# Patient Record
Sex: Female | Born: 1940 | Race: White | Hispanic: No | Smoking: Current some day smoker
Health system: Southern US, Community
[De-identification: ages and names within clinical notes are randomized; demographics above are authoritative.]

## PROBLEM LIST (undated history)

## (undated) DIAGNOSIS — R112 Nausea with vomiting, unspecified: Secondary | ICD-10-CM

## (undated) DIAGNOSIS — R0602 Shortness of breath: Secondary | ICD-10-CM

## (undated) DIAGNOSIS — Z9889 Other specified postprocedural states: Secondary | ICD-10-CM

## (undated) DIAGNOSIS — I10 Essential (primary) hypertension: Secondary | ICD-10-CM

## (undated) DIAGNOSIS — E7800 Pure hypercholesterolemia, unspecified: Secondary | ICD-10-CM

## (undated) DIAGNOSIS — G44009 Cluster headache syndrome, unspecified, not intractable: Secondary | ICD-10-CM

## (undated) DIAGNOSIS — T8859XA Other complications of anesthesia, initial encounter: Secondary | ICD-10-CM

## (undated) DIAGNOSIS — T4145XA Adverse effect of unspecified anesthetic, initial encounter: Secondary | ICD-10-CM

## (undated) DIAGNOSIS — I639 Cerebral infarction, unspecified: Secondary | ICD-10-CM

## (undated) DIAGNOSIS — D649 Anemia, unspecified: Secondary | ICD-10-CM

## (undated) DIAGNOSIS — M1990 Unspecified osteoarthritis, unspecified site: Secondary | ICD-10-CM

## (undated) DIAGNOSIS — J449 Chronic obstructive pulmonary disease, unspecified: Secondary | ICD-10-CM

## (undated) DIAGNOSIS — Z973 Presence of spectacles and contact lenses: Secondary | ICD-10-CM

## (undated) HISTORY — DX: Essential (primary) hypertension: I10

## (undated) HISTORY — DX: Presence of spectacles and contact lenses: Z97.3

## (undated) HISTORY — PX: OTHER SURGICAL HISTORY: SHX169

## (undated) HISTORY — DX: Cluster headache syndrome, unspecified, not intractable: G44.009

## (undated) HISTORY — PX: VARICOSE VEIN SURGERY: SHX832

## (undated) HISTORY — DX: Chronic obstructive pulmonary disease, unspecified: J44.9

## (undated) HISTORY — DX: Cerebral infarction, unspecified: I63.9

## (undated) HISTORY — DX: Anemia, unspecified: D64.9

## (undated) HISTORY — PX: ABDOMINAL HYSTERECTOMY: SHX81

## (undated) HISTORY — PX: HAND SURGERY: SHX662

---

## 1998-02-18 ENCOUNTER — Other Ambulatory Visit: Admission: RE | Admit: 1998-02-18 | Discharge: 1998-02-18 | Payer: Self-pay | Admitting: Internal Medicine

## 1998-06-12 ENCOUNTER — Emergency Department (HOSPITAL_COMMUNITY): Admission: EM | Admit: 1998-06-12 | Discharge: 1998-06-12 | Payer: Self-pay | Admitting: Emergency Medicine

## 1998-06-12 ENCOUNTER — Encounter: Payer: Self-pay | Admitting: Emergency Medicine

## 2001-05-10 ENCOUNTER — Encounter: Payer: Self-pay | Admitting: Emergency Medicine

## 2001-05-10 ENCOUNTER — Emergency Department (HOSPITAL_COMMUNITY): Admission: EM | Admit: 2001-05-10 | Discharge: 2001-05-10 | Payer: Self-pay | Admitting: Emergency Medicine

## 2001-10-26 ENCOUNTER — Emergency Department (HOSPITAL_COMMUNITY): Admission: EM | Admit: 2001-10-26 | Discharge: 2001-10-26 | Payer: Self-pay | Admitting: Emergency Medicine

## 2001-10-27 ENCOUNTER — Encounter: Payer: Self-pay | Admitting: Emergency Medicine

## 2003-02-26 ENCOUNTER — Encounter: Admission: RE | Admit: 2003-02-26 | Discharge: 2003-02-26 | Payer: Self-pay | Admitting: Internal Medicine

## 2004-07-14 ENCOUNTER — Emergency Department (HOSPITAL_COMMUNITY): Admission: EM | Admit: 2004-07-14 | Discharge: 2004-07-15 | Payer: Self-pay | Admitting: Emergency Medicine

## 2008-02-19 ENCOUNTER — Encounter: Admission: RE | Admit: 2008-02-19 | Discharge: 2008-02-19 | Payer: Self-pay | Admitting: Family Medicine

## 2008-04-08 ENCOUNTER — Encounter (INDEPENDENT_AMBULATORY_CARE_PROVIDER_SITE_OTHER): Payer: Self-pay | Admitting: *Deleted

## 2009-07-01 ENCOUNTER — Encounter: Admission: RE | Admit: 2009-07-01 | Discharge: 2009-07-01 | Payer: Self-pay | Admitting: Family Medicine

## 2009-08-05 ENCOUNTER — Encounter: Admission: RE | Admit: 2009-08-05 | Discharge: 2009-08-05 | Payer: Self-pay | Admitting: Family Medicine

## 2009-08-18 ENCOUNTER — Encounter: Admission: RE | Admit: 2009-08-18 | Discharge: 2009-08-18 | Payer: Self-pay | Admitting: Gastroenterology

## 2010-01-10 DIAGNOSIS — I639 Cerebral infarction, unspecified: Secondary | ICD-10-CM

## 2010-01-10 HISTORY — DX: Cerebral infarction, unspecified: I63.9

## 2010-01-25 ENCOUNTER — Inpatient Hospital Stay (HOSPITAL_COMMUNITY)
Admission: EM | Admit: 2010-01-25 | Discharge: 2010-01-27 | Payer: Self-pay | Source: Home / Self Care | Attending: Internal Medicine | Admitting: Internal Medicine

## 2010-01-27 ENCOUNTER — Encounter: Payer: Self-pay | Admitting: Cardiology

## 2010-01-27 ENCOUNTER — Encounter (INDEPENDENT_AMBULATORY_CARE_PROVIDER_SITE_OTHER): Payer: Self-pay | Admitting: Internal Medicine

## 2010-01-27 LAB — DIFFERENTIAL
Basophils Absolute: 0 10*3/uL (ref 0.0–0.1)
Basophils Relative: 0 % (ref 0–1)
Eosinophils Absolute: 0 10*3/uL (ref 0.0–0.7)
Eosinophils Relative: 0 % (ref 0–5)
Lymphocytes Relative: 14 % (ref 12–46)
Lymphs Abs: 1.4 10*3/uL (ref 0.7–4.0)
Monocytes Absolute: 0.8 10*3/uL (ref 0.1–1.0)
Monocytes Relative: 8 % (ref 3–12)
Neutro Abs: 7.8 10*3/uL — ABNORMAL HIGH (ref 1.7–7.7)
Neutrophils Relative %: 78 % — ABNORMAL HIGH (ref 43–77)

## 2010-01-27 LAB — CBC
HCT: 43 % (ref 36.0–46.0)
HCT: 37.7 % (ref 36.0–46.0)
Hemoglobin: 14 g/dL (ref 12.0–15.0)
Hemoglobin: 12.3 g/dL (ref 12.0–15.0)
MCH: 30.6 pg (ref 26.0–34.0)
MCH: 30.5 pg (ref 26.0–34.0)
MCHC: 32.6 g/dL (ref 30.0–36.0)
MCHC: 32.6 g/dL (ref 30.0–36.0)
MCV: 94.1 fL (ref 78.0–100.0)
MCV: 93.5 fL (ref 78.0–100.0)
Platelets: 248 10*3/uL (ref 150–400)
Platelets: 227 10*3/uL (ref 150–400)
RBC: 4.57 MIL/uL (ref 3.87–5.11)
RBC: 4.03 MIL/uL (ref 3.87–5.11)
RDW: 13 % (ref 11.5–15.5)
RDW: 12.9 % (ref 11.5–15.5)
WBC: 10.1 10*3/uL (ref 4.0–10.5)
WBC: 9 10*3/uL (ref 4.0–10.5)

## 2010-01-27 LAB — POCT CARDIAC MARKERS
CKMB, poc: <1 ng/mL — ABNORMAL LOW (ref 1.0–8.0)
Myoglobin, poc: 97.4 ng/mL (ref 12–200)
Troponin i, poc: <0.05 ng/mL (ref 0.00–0.09)

## 2010-01-27 LAB — TSH: TSH: 1.442 u[IU]/mL (ref 0.350–4.500)

## 2010-01-27 LAB — COMPREHENSIVE METABOLIC PANEL
ALT: 15 U/L (ref 0–35)
ALT: 12 U/L (ref 0–35)
AST: 22 U/L (ref 0–37)
AST: 13 U/L (ref 0–37)
Albumin: 3.7 g/dL (ref 3.5–5.2)
Albumin: 3.2 g/dL — ABNORMAL LOW (ref 3.5–5.2)
Alkaline Phosphatase: 99 U/L (ref 39–117)
Alkaline Phosphatase: 84 U/L (ref 39–117)
BUN: 10 mg/dL (ref 6–23)
BUN: 9 mg/dL (ref 6–23)
CO2: 28 mEq/L (ref 19–32)
CO2: 29 mEq/L (ref 19–32)
Calcium: 9.6 mg/dL (ref 8.4–10.5)
Calcium: 9.3 mg/dL (ref 8.4–10.5)
Chloride: 100 mEq/L (ref 96–112)
Chloride: 102 mEq/L (ref 96–112)
Creatinine, Ser: 0.88 mg/dL (ref 0.4–1.2)
Creatinine, Ser: 0.75 mg/dL (ref 0.4–1.2)
GFR calc Af Amer: >60 mL/min (ref >60)
GFR calc Af Amer: >60 mL/min (ref >60)
GFR calc non Af Amer: >60 mL/min (ref >60)
GFR calc non Af Amer: >60 mL/min (ref >60)
Glucose, Bld: 123 mg/dL — ABNORMAL HIGH (ref 70–99)
Glucose, Bld: 104 mg/dL — ABNORMAL HIGH (ref 70–99)
Potassium: 4 mEq/L (ref 3.5–5.1)
Potassium: 3.7 mEq/L (ref 3.5–5.1)
Sodium: 140 mEq/L (ref 135–145)
Sodium: 140 mEq/L (ref 135–145)
Total Bilirubin: 0.4 mg/dL (ref 0.3–1.2)
Total Bilirubin: 0.7 mg/dL (ref 0.3–1.2)
Total Protein: 6.7 g/dL (ref 6.0–8.3)
Total Protein: 5.8 g/dL — ABNORMAL LOW (ref 6.0–8.3)

## 2010-01-27 LAB — CK TOTAL AND CKMB (NOT AT ARMC)
CK, MB: 2.4 ng/mL (ref 0.3–4.0)
Relative Index: 1.8 (ref 0.0–2.5)
Total CK: 136 U/L (ref 7–177)

## 2010-01-27 LAB — PROTIME-INR
INR: 1.04 (ref 0.00–1.49)
Prothrombin Time: 13.8 seconds (ref 11.6–15.2)

## 2010-01-27 LAB — BRAIN NATRIURETIC PEPTIDE: Pro B Natriuretic peptide (BNP): 81 pg/mL (ref 0.0–100.0)

## 2010-01-27 LAB — TROPONIN I: Troponin I: 0.01 ng/mL (ref 0.00–0.06)

## 2010-01-27 LAB — HEMOGLOBIN A1C
Hgb A1c MFr Bld: 5.7 % — ABNORMAL HIGH (ref <5.7)
Mean Plasma Glucose: 117 mg/dL — ABNORMAL HIGH (ref <117)

## 2010-01-27 LAB — HOMOCYSTEINE: Homocysteine: 8.1 umol/L (ref 4.0–15.4)

## 2010-01-27 LAB — CK: Total CK: 62 U/L (ref 7–177)

## 2010-01-27 LAB — APTT: aPTT: 32 seconds (ref 24–37)

## 2010-02-01 LAB — BASIC METABOLIC PANEL
BUN: 6 mg/dL (ref 6–23)
CO2: 31 mEq/L (ref 19–32)
Chloride: 101 mEq/L (ref 96–112)
Creatinine, Ser: 0.78 mg/dL (ref 0.4–1.2)
GFR calc Af Amer: >60 mL/min (ref >60)
Glucose, Bld: 90 mg/dL (ref 70–99)

## 2010-02-01 LAB — LIPID PANEL
HDL: 54 mg/dL (ref >39)
Total CHOL/HDL Ratio: 2.1 RATIO
Triglycerides: 55 mg/dL (ref <150)

## 2010-02-01 LAB — CBC
HCT: 38.1 % (ref 36.0–46.0)
Hemoglobin: 12.1 g/dL (ref 12.0–15.0)
MCH: 29.7 pg (ref 26.0–34.0)
MCHC: 31.8 g/dL (ref 30.0–36.0)
MCV: 93.4 fL (ref 78.0–100.0)
Platelets: 200 10*3/uL (ref 150–400)
RBC: 4.08 MIL/uL (ref 3.87–5.11)
RDW: 12.8 % (ref 11.5–15.5)
WBC: 6.5 10*3/uL (ref 4.0–10.5)

## 2010-02-01 LAB — DIFFERENTIAL
Basophils Absolute: 0 10*3/uL (ref 0.0–0.1)
Basophils Relative: 1 % (ref 0–1)
Eosinophils Absolute: 0.2 10*3/uL (ref 0.0–0.7)
Eosinophils Relative: 2 % (ref 0–5)
Lymphocytes Relative: 30 % (ref 12–46)
Lymphs Abs: 1.9 10*3/uL (ref 0.7–4.0)
Monocytes Absolute: 0.6 10*3/uL (ref 0.1–1.0)
Monocytes Relative: 9 % (ref 3–12)
Neutro Abs: 3.8 10*3/uL (ref 1.7–7.7)
Neutrophils Relative %: 59 % (ref 43–77)

## 2010-02-01 LAB — PHOSPHORUS: Phosphorus: 3.6 mg/dL (ref 2.3–4.6)

## 2010-06-09 ENCOUNTER — Encounter (INDEPENDENT_AMBULATORY_CARE_PROVIDER_SITE_OTHER): Payer: Self-pay | Admitting: General Surgery

## 2010-07-19 ENCOUNTER — Encounter (HOSPITAL_COMMUNITY)
Admission: RE | Admit: 2010-07-19 | Discharge: 2010-07-19 | Disposition: A | Discharge status: Home / Self Care | Payer: Medicare Other | Source: Ambulatory Visit | Attending: General Surgery | Admitting: General Surgery

## 2010-07-19 LAB — SURGICAL PCR SCREEN
MRSA, PCR: NEGATIVE
Staphylococcus aureus: NEGATIVE

## 2010-07-19 LAB — COMPREHENSIVE METABOLIC PANEL
BUN: 19 mg/dL (ref 6–23)
Calcium: 9.6 mg/dL (ref 8.4–10.5)
GFR calc Af Amer: >60 mL/min (ref >60)
Glucose, Bld: 95 mg/dL (ref 70–99)
Total Protein: 6.5 g/dL (ref 6.0–8.3)

## 2010-07-19 LAB — DIFFERENTIAL
Basophils Absolute: 0 10*3/uL (ref 0.0–0.1)
Basophils Relative: 0 % (ref 0–1)
Eosinophils Absolute: 0.1 10*3/uL (ref 0.0–0.7)
Monocytes Relative: 6 % (ref 3–12)
Neutrophils Relative %: 67 % (ref 43–77)

## 2010-07-19 LAB — CBC
MCH: 30.3 pg (ref 26.0–34.0)
MCHC: 32.9 g/dL (ref 30.0–36.0)
Platelets: 200 10*3/uL (ref 150–400)
RBC: 4.42 MIL/uL (ref 3.87–5.11)

## 2010-07-19 LAB — PROTIME-INR: Prothrombin Time: 12.7 seconds (ref 11.6–15.2)

## 2010-07-20 ENCOUNTER — Ambulatory Visit (HOSPITAL_COMMUNITY): Payer: Medicare Other

## 2010-07-20 ENCOUNTER — Ambulatory Visit (HOSPITAL_COMMUNITY)
Admission: RE | Admit: 2010-07-20 | Discharge: 2010-07-20 | Disposition: A | Discharge status: Home / Self Care | Payer: Medicare Other | Source: Ambulatory Visit | Attending: General Surgery | Admitting: General Surgery

## 2010-07-20 ENCOUNTER — Other Ambulatory Visit (INDEPENDENT_AMBULATORY_CARE_PROVIDER_SITE_OTHER): Payer: Self-pay | Admitting: General Surgery

## 2010-07-20 DIAGNOSIS — I69998 Other sequelae following unspecified cerebrovascular disease: Secondary | ICD-10-CM | POA: Insufficient documentation

## 2010-07-20 DIAGNOSIS — M129 Arthropathy, unspecified: Secondary | ICD-10-CM | POA: Insufficient documentation

## 2010-07-20 DIAGNOSIS — J449 Chronic obstructive pulmonary disease, unspecified: Secondary | ICD-10-CM | POA: Insufficient documentation

## 2010-07-20 DIAGNOSIS — J4489 Other specified chronic obstructive pulmonary disease: Secondary | ICD-10-CM | POA: Insufficient documentation

## 2010-07-20 DIAGNOSIS — Z01812 Encounter for preprocedural laboratory examination: Secondary | ICD-10-CM | POA: Insufficient documentation

## 2010-07-20 DIAGNOSIS — K8010 Calculus of gallbladder with chronic cholecystitis without obstruction: Secondary | ICD-10-CM

## 2010-07-20 DIAGNOSIS — I10 Essential (primary) hypertension: Secondary | ICD-10-CM | POA: Insufficient documentation

## 2010-07-20 HISTORY — PX: CHOLECYSTECTOMY: SHX55

## 2010-07-28 NOTE — Op Note (Signed)
[name], [name]                 ACCOUNT NO.:  [id number]  MEDICAL RECORD NO.:  [id number]  LOCATION:  [facility]                         FACILITY:  [facility]  PHYSICIAN:  Mary Sella. Andrey Campanile, MD     DATE OF BIRTH:  05/12/1940  DATE OF PROCEDURE:  07/20/2010 DATE OF DISCHARGE:                              OPERATIVE REPORT   PREOPERATIVE DIAGNOSIS:  Symptomatic cholelithiasis.  POSTOPERATIVE DIAGNOSIS:  Symptomatic cholelithiasis.  PROCEDURE:  Laparoscopic cholecystectomy with intraoperative cholangiogram.  SURGEON:  Mary Sella. Andrey Campanile, MD  ASSISTANT SURGEON:  Currie Paris, M.D.  ANESTHESIA:  General plus 20 mL of 0.25% Marcaine with epinephrine.  FINDINGS:  The patient had a normal cholangiogram.  There was prompt opacification of the cystic duct, common bile duct, common hepatic, left and right hepatic ducts as well as prompt emptying into the duodenum. There was no evidence of filling defect.  The critical view was obtained.  ESTIMATED BLOOD LOSS:  Minimal.  COMPLICATIONS:  None immediately apparent.  INDICATIONS FOR PROCEDURE:  The patient is a very pleasant 22 year old Caucasian female who I initially met in October for postprandial epigastric and right upper quadrant pain.  She has undergone an extensive workup including EGD, CT scan, MRI which all revealed gallstones.  We had actually scheduled her for laparoscopic cholecystectomy in November; however, she had to cancel it due to a family death and then ultimately she developed cerebrovascular accident in January 2012.  It has been 7 months since that event and she has been cleared by her cardiologist.  We discussed the risks and benefits of surgery including, but not limited to bleeding, infection, injury to surrounding structures, need to convert to an open procedure, bile leak, prolonged diarrhea, injury to surrounding structures, DVT occurrence, perioperative cardiac events, injury to the common bile duct  requiring major reconstructive bile duct surgery as well as failure to ameliorate her abdominal pain.  She elects to proceed with surgery.  DESCRIPTION OF PROCEDURE:  After obtaining informed consent, the patient was brought back to the operating room, placed supine on the operating room table.  General endotracheal anesthesia was established.  A surgical time-out was performed.  Sequential compression devices had been placed.  Her abdomen was prepped and draped in usual standard surgical fashion with ChloraPrep.  She received ciprofloxacin prior to skin incision.  Local was infiltrated at the base of her umbilicus. Next a 1.5-cm incision was made with a #11 blade.  The fascia was grasped and lifted anteriorly.  Next, the fascia was incised with a #11 blade and a pursestring suture was placed around the fascial edges consisting of a 0 Vicryl on a UR6 needle.  Next, a 5-mm Hasson trocar was placed and the laparoscope was advanced.  The patient's abdomen had been inflated to a pressure of 15 mmHg without any complications.  The patient had a very large floppy left lobe of the liver.  However, grossly her liver appeared very healthy and normal.  We placed three additional 5-mm trocars all in the upper abdomen, one in the subxiphoid, and two in the right hypochondrium, all under direct visualization after local had been infiltrated.  The patient's gallbladder was grasped  and retracted toward the right shoulder.  She had some omental attachments. These were gently stripped down with blunt dissection as well as hook electrocautery.  The neck was grasped and retracted laterally.  I then incised the overlying peritoneum with hook electrocautery both medially and laterally.  Then using a [place], I circumferentially dissected out the cystic duct and the cystic artery establishing a clear window between the two visualizing the posterior liver.  There was no other ductal structures  entering the gallbladder.  A clip was placed across the cystic duct as it entered the gallbladder, then partially transected with Endo Shears with bile draining out of the cystotomy. The cholangiogram catheter was percutaneously introduced through the abdominal wall and threaded into the cystic duct and secured with a clip.  The cholangiogram was described with the results above.  We reestablished pneumoperitoneum, removed the clip securing the cholangiogram catheter,  and removing the cholangiogram catheter from the abdominal cavity.  Three clips were placed on the downside of the cystic duct on the biliary side.  It was then transected with Endo Shears.  Two clips were placed on the downside of the cystic artery and one distally as it entered the gallbladder, then transected with Endo Shears.  I then rolled the gallbladder up at the gallbladder fossa using the hook electrocautery.  The gallbladder wall was very thin-walled posteriorly.  The gallbladder was entered with some spillage of bile. However, there is no spillage of gallstones.  The gallbladder was eventually freed from the gallbladder fossa.  A 5-mm Endobag was advanced through the umbilical trocar and the gallbladder was placed within the bag.  The bag containing the gallbladder was removed from the abdominal cavity.  Pneumoperitoneum was reestablished by replacing the trocar.  I then irrigated the right upper quadrant with 1 liter of saline.  There was no evidence of bile leak.  There was no evidence of bleeding.  The clips were secured across the ductal structures.  The remaining irrigation fluid was evacuated.  The previously placed Hasson trocar was removed and the previously placed pursestring suture was tied down thus obliterating the fascial defect.  There was no air leak at the umbilicus.  The remaining trocars were removed.  Prior to doing this, I did inject some local in the preperitoneal space at the umbilicus  for additional local anesthesia.  The remaining trocars were removed.  Skin incisions were closed with 4-0 Monocryl in subcuticular fashion followed by the application of Benzoin, Steri-Strips, and sterile bandages.  All needle, instrument, sponge counts were correct x2.  There were no complications.     Mary Sella. Andrey Campanile, MD     EMW/MEDQ  D:  07/20/2010  T:  07/20/2010  Job:  [id number]  cc:   Jamey Reas, MD Dr. De Nurse, MD  Electronically Signed by Gaynelle Adu M.D. on 07/28/2010 11:43:45 PM

## 2010-08-11 ENCOUNTER — Ambulatory Visit (INDEPENDENT_AMBULATORY_CARE_PROVIDER_SITE_OTHER): Payer: Medicare Other | Admitting: General Surgery

## 2010-08-11 ENCOUNTER — Encounter (INDEPENDENT_AMBULATORY_CARE_PROVIDER_SITE_OTHER): Payer: Self-pay | Admitting: General Surgery

## 2010-08-11 DIAGNOSIS — Z9049 Acquired absence of other specified parts of digestive tract: Secondary | ICD-10-CM

## 2010-08-11 DIAGNOSIS — Z9889 Other specified postprocedural states: Secondary | ICD-10-CM

## 2010-08-11 DIAGNOSIS — Z09 Encounter for follow-up examination after completed treatment for conditions other than malignant neoplasm: Secondary | ICD-10-CM

## 2010-08-11 NOTE — Progress Notes (Signed)
CC: postop   Procedure: Laparoscopic cholecystectomy with intraoperative cholangiogram July 20, 2010  History of Present Ilness: 70 year old Caucasian female comes in today for her postoperative visit. She is accompanied by her husband. She has no complaints. She wants to know when she can resume eating salads. She denies any fever, chills, constipation, or abdominal pain. She states that she did not take any pain medicines after surgery. She reports one episode of emesis last week in the middle the night. She denies any diarrhea. She reports a good appetite.  Physical Exam: There were no vitals taken for this visit.  Well-developed well-nourished Caucasian female in no apparent distress Pulmonary-lungs are clear to auscultation Abdomen-soft, nontender, nondistended. Well-healed trocar incisions. No signs of incisional hernia. Skin-no jaundice  Pathology: Gallbladder showed chronic cholecystitis and cholelithiasis  Assessment and Plan: 70 year old female status post laparoscopic cholecystectomy with intraoperative cholangiogram for chronic cholecystitis and cholelithiasis. We reviewed her pathology. I released her to full activity. I informed her she can eat salads. I will see her on a p.r.n. basis

## 2011-03-09 ENCOUNTER — Emergency Department (HOSPITAL_COMMUNITY)
Admission: EM | Admit: 2011-03-09 | Discharge: 2011-03-10 | Disposition: A | Discharge status: Home Health | Payer: Medicare Other | Attending: Emergency Medicine | Admitting: Emergency Medicine

## 2011-03-09 ENCOUNTER — Emergency Department (HOSPITAL_COMMUNITY): Payer: Medicare Other

## 2011-03-09 ENCOUNTER — Encounter (HOSPITAL_COMMUNITY): Payer: Self-pay | Admitting: *Deleted

## 2011-03-09 ENCOUNTER — Other Ambulatory Visit: Payer: Self-pay

## 2011-03-09 DIAGNOSIS — R0989 Other specified symptoms and signs involving the circulatory and respiratory systems: Secondary | ICD-10-CM | POA: Insufficient documentation

## 2011-03-09 DIAGNOSIS — J441 Chronic obstructive pulmonary disease with (acute) exacerbation: Secondary | ICD-10-CM

## 2011-03-09 DIAGNOSIS — R0602 Shortness of breath: Secondary | ICD-10-CM | POA: Insufficient documentation

## 2011-03-09 DIAGNOSIS — R0609 Other forms of dyspnea: Secondary | ICD-10-CM | POA: Insufficient documentation

## 2011-03-09 DIAGNOSIS — R0600 Dyspnea, unspecified: Secondary | ICD-10-CM

## 2011-03-09 LAB — URINALYSIS, ROUTINE W REFLEX MICROSCOPIC
Glucose, UA: NEGATIVE mg/dL
Leukocytes, UA: NEGATIVE
Protein, ur: NEGATIVE mg/dL
Specific Gravity, Urine: 1.012 (ref 1.005–1.030)
pH: 7 (ref 5.0–8.0)

## 2011-03-09 LAB — COMPREHENSIVE METABOLIC PANEL
Albumin: 3.7 g/dL (ref 3.5–5.2)
BUN: 18 mg/dL (ref 6–23)
Calcium: 10.3 mg/dL (ref 8.4–10.5)
Creatinine, Ser: 1.13 mg/dL — ABNORMAL HIGH (ref 0.50–1.10)
Total Bilirubin: 0.2 mg/dL — ABNORMAL LOW (ref 0.3–1.2)
Total Protein: 6.9 g/dL (ref 6.0–8.3)

## 2011-03-09 LAB — CBC
HCT: 39.1 % (ref 36.0–46.0)
Hemoglobin: 12.9 g/dL (ref 12.0–15.0)
MCH: 31.3 pg (ref 26.0–34.0)
MCHC: 33 g/dL (ref 30.0–36.0)
RDW: 13 % (ref 11.5–15.5)

## 2011-03-09 LAB — DIFFERENTIAL
Basophils Relative: 0 % (ref 0–1)
Eosinophils Absolute: 0.2 10*3/uL (ref 0.0–0.7)
Eosinophils Relative: 3 % (ref 0–5)
Monocytes Absolute: 0.4 10*3/uL (ref 0.1–1.0)
Monocytes Relative: 7 % (ref 3–12)

## 2011-03-09 MED ORDER — ALBUTEROL SULFATE (5 MG/ML) 0.5% IN NEBU
2.5000 mg | INHALATION_SOLUTION | RESPIRATORY_TRACT | Status: AC
  Administered 2011-03-09: 2.5 mg via RESPIRATORY_TRACT
  Filled 2011-03-09: qty 2

## 2011-03-09 MED ORDER — IPRATROPIUM BROMIDE 0.02 % IN SOLN
0.5000 mg | RESPIRATORY_TRACT | Status: AC
  Administered 2011-03-09: 0.5 mg via RESPIRATORY_TRACT
  Filled 2011-03-09: qty 5

## 2011-03-09 MED ORDER — METHYLPREDNISOLONE SODIUM SUCC 125 MG IJ SOLR
125.0000 mg | Freq: Once | INTRAMUSCULAR | Status: AC
  Administered 2011-03-09: 125 mg via INTRAVENOUS
  Filled 2011-03-09 (×2): qty 2

## 2011-03-09 MED ORDER — PREDNISONE 20 MG PO TABS: ORAL_TABLET | ORAL | Status: AC

## 2011-03-09 NOTE — ED Notes (Signed)
Pt from [facility].  Had a CVA last year-has (L) sided deficits.  Pt started to have SOB tonight, with wheezing in upper fields.  Given 5/0.5 breathing treatment en route.  Pt jittery, nervous prior to treatment.  Vitals stable.

## 2011-03-09 NOTE — ED Provider Notes (Signed)
11:39 PM  I performed a history and physical examination of Carolyn [name] and discussed her management with Dr. Fayrene Fearing.  I agree with the history, physical, assessment, and plan of care, with the following exceptions: None  The patient is awake, alert, and oriented appropriately, in no apparent distress, no respiratory distress, breathing easily without tachypnea or accessory muscle usage and oxygen saturation in the high 90s. She reports that her symptoms have resolved completely. On auscultation of her lungs, she has good air exchange in all fields and no wheezes, rales, or rhonchi. Heart sounds are normal and regular in rate and rhythm. Skin is warm and dry.  I was present for the following procedures: None Time Spent in Critical Care of the patient: None Time spent in discussions with the patient and family: 5 minutes.  Manus Rudd, MD 03/09/11 [phone]

## 2011-03-09 NOTE — Discharge Instructions (Signed)
Chronic Bronchitis and Emphysema Exacerbation You have chronic obstructive lung disease which has gotten worse. This can be an increase in your cough, wheezing, or shortness of breath. These problems are often worse during periods of air pollution or if you are exposed to smoke. HOME CARE INSTRUCTIONS   You should avoid all substances which irritate the airway, especially tobacco smoke.   If you are a smoker, consider using nicotine gum or patches to quit. Quitting smoking is very important to prevent worsening of this disease.   Increasing oral fluids and using a cool air vaporizer can help thin bronchial secretions. This makes it easier to clear your chest when you cough.   If you have a home nebulizer and oxygen, continue to use them as directed.  The following medications may be prescribed to help you depending on what your caregiver finds today.   Antibiotics.   Bronchodilators (inhaled or tablets).   Cortisone medicines (inhaled or tablets).   It is important to use good technique with inhaled medications, and spacer devices may be needed to help improve drug delivery. Chronic lung disease is frequently severe and hospital care may be needed. Be sure to follow-up as recommended with your primary caregiver.   Take all medications as directed.  SEEK IMMEDIATE MEDICAL CARE IF:  You develop extreme shortness of breath.   You have severe chest pain or blood in your sputum.   You develop a high fever, weakness, repeated vomiting, or fainting.   You develop confusion.  MAKE SURE YOU:   Understand these instructions.   Will watch your condition.   Will get help right away if you are not doing well or get worse.  Document Released: 10/24/2006 Document Revised: 07/12/2010 Document Reviewed: 10/24/2006 Exit[employer] Patient Information 2012 [employer]. 

## 2011-03-09 NOTE — ED Provider Notes (Addendum)
History     CSN: [id number]  Arrival date & time 03/09/11  2147   First MD Initiated Contact with Patient 03/09/11 2150      Chief Complaint  Patient presents with  . Shortness of Breath    (Consider location/radiation/quality/duration/timing/severity/associated sxs/prior treatment) Patient is a 71 y.o. female presenting with shortness of breath. The history is provided by the patient and the EMS personnel.  Shortness of Breath  The current episode started 3 to 5 days ago. The problem occurs occasionally. The problem has been gradually worsening. The problem is moderate. The symptoms are relieved by beta-agonist inhalers (Transient relief with albuterol inhaler.). The symptoms are aggravated by nothing. Associated symptoms include cough (Dry, for the past 3 days.), shortness of breath and wheezing. Pertinent negatives include no chest pain, no chest pressure, no fever and no rhinorrhea. Associated symptoms comments: Congestion.. Her past medical history is significant for past wheezing. There were sick contacts at home (Patient reports multiple sick contacts at her facility where she resides.).    Past Medical History  Diagnosis Date  . Hypertension   . COPD (chronic obstructive pulmonary disease)   . Anemia   . Headaches, cluster   . Wears glasses   . CVA (cerebral vascular accident) 01/2010    Past Surgical History  Procedure Date  . Abdominal hysterectomy   . Varicose vein surgery   . Left hand   . Cholecystectomy 07/20/10    Dr Andrey Campanile; Lap chole with ioc    Family History  Problem Relation Age of Onset  . Heart disease Father   . Heart disease Mother   . Diabetes Father   . Hypertension Mother   . Cancer Maternal Aunt     ovarian, breast    History  Substance Use Topics  . Smoking status: Former Games developer  . Smokeless tobacco: Not on file  . Alcohol Use: No    OB History    Grav Para Term Preterm Abortions TAB SAB Ect Mult Living                  Review of  Systems  Constitutional: Negative for fever.  HENT: Negative for rhinorrhea.   Respiratory: Positive for cough (Dry, for the past 3 days.), shortness of breath and wheezing.   Cardiovascular: Negative for chest pain.  All other systems reviewed and are negative.    Allergies  Codeine and Penicillins  Home Medications   Current Outpatient Rx  Name Route Sig Dispense Refill  . ACETAMINOPHEN 325 MG PO TABS Oral Take 650 mg by mouth every 6 (six) hours as needed.      . ACETAMINOPHEN 500 MG PO TABS Oral Take 500 mg by mouth every 4 (four) hours as needed.      Marland Kitchen PROAIR HFA IN Inhalation Inhale 90 mcg into the lungs 3 (three) times daily.      Marland Kitchen AMLODIPINE BESYLATE 5 MG PO TABS Oral Take 5 mg by mouth daily.      . ASPIRIN 81 MG PO TABS Oral Take 81 mg by mouth daily.      . BUDESONIDE-FORMOTEROL FUMARATE 80-4.5 MCG/ACT IN AERO Inhalation Inhale 2 puffs into the lungs 2 (two) times daily.      Marland Kitchen CALCIUM-VITAMIN D PO Oral Take by mouth.      Marland Kitchen CILOSTAZOL 100 MG PO TABS Oral Take 100 mg by mouth 2 (two) times daily.      Marland Kitchen CLOPIDOGREL BISULFATE 75 MG PO TABS Oral Take 75 mg  by mouth daily.      Marland Kitchen LOSARTAN POTASSIUM 50 MG PO TABS Oral Take 50 mg by mouth daily.      Marland Kitchen MAGNESIUM 400 MG PO CAPS Oral Take by mouth daily.      Marland Kitchen METOPROLOL SUCCINATE ER 100 MG PO TB24 Oral Take 100 mg by mouth daily.      Marland Kitchen METOPROLOL TARTRATE 25 MG PO TABS Oral Take 25 mg by mouth daily.      Marland Kitchen OMEPRAZOLE 20 MG PO CPDR Oral Take 20 mg by mouth daily.      Marland Kitchen ONDANSETRON HCL 4 MG PO TABS Oral Take 4 mg by mouth every 8 (eight) hours as needed.      Marland Kitchen PRAVASTATIN SODIUM 20 MG PO TABS Oral Take 20 mg by mouth daily.      Marland Kitchen PROMETHAZINE HCL 25 MG PO TABS Oral Take 25 mg by mouth every 6 (six) hours as needed.        There were no vitals taken for this visit.  Physical Exam  Constitutional: She is oriented to person, place, and time. She appears well-developed and well-nourished. No distress.  HENT:  Head:  Normocephalic and atraumatic.  Mouth/Throat: Oropharynx is clear and moist.  Eyes: EOM are normal. Pupils are equal, round, and reactive to light.  Neck: Normal range of motion. Neck supple.  Cardiovascular: Normal rate, regular rhythm and normal heart sounds.  Exam reveals no friction rub.   No murmur heard. Pulmonary/Chest: No respiratory distress. She has wheezes. She has rales.       Patient is tachypneic but able to speak in full sentences. Intercostal accessory muscle use noted. Diffuse scattered wheezing in anterior and posterior lung fields with mild bibasilar crackles.  Abdominal: Soft. There is no tenderness. There is no rebound and no guarding.  Musculoskeletal: Normal range of motion. She exhibits no edema and no tenderness.  Lymphadenopathy:    She has no cervical adenopathy.  Neurological: She is alert and oriented to person, place, and time.  Skin: Skin is warm and dry. No rash noted.  Psychiatric: She has a normal mood and affect. Her behavior is normal.    ED Course  Procedures (including critical care time)  Results for orders placed during the hospital encounter of 03/09/11  CBC      Component Value Range   WBC 6.0  4.0 - 10.5 (K/uL)   RBC 4.12  3.87 - 5.11 (MIL/uL)   Hemoglobin 12.9  12.0 - 15.0 (g/dL)   HCT 16.1  09.6 - 04.5 (%)   MCV 94.9  78.0 - 100.0 (fL)   MCH 31.3  26.0 - 34.0 (pg)   MCHC 33.0  30.0 - 36.0 (g/dL)   RDW 40.9  81.1 - 91.4 (%)   Platelets 222  150 - 400 (K/uL)  DIFFERENTIAL      Component Value Range   Neutrophils Relative 54  43 - 77 (%)   Neutro Abs 3.3  1.7 - 7.7 (K/uL)   Lymphocytes Relative 36  12 - 46 (%)   Lymphs Abs 2.2  0.7 - 4.0 (K/uL)   Monocytes Relative 7  3 - 12 (%)   Monocytes Absolute 0.4  0.1 - 1.0 (K/uL)   Eosinophils Relative 3  0 - 5 (%)   Eosinophils Absolute 0.2  0.0 - 0.7 (K/uL)   Basophils Relative 0  0 - 1 (%)   Basophils Absolute 0.0  0.0 - 0.1 (K/uL)  COMPREHENSIVE METABOLIC PANEL  Component Value  Range   Sodium 138  135 - 145 (mEq/L)   Potassium 4.4  3.5 - 5.1 (mEq/L)   Chloride 102  96 - 112 (mEq/L)   CO2 28  19 - 32 (mEq/L)   Glucose, Bld 105 (*) 70 - 99 (mg/dL)   BUN 18  6 - 23 (mg/dL)   Creatinine, Ser 4.54 (*) 0.50 - 1.10 (mg/dL)   Calcium 09.8  8.4 - 10.5 (mg/dL)   Total Protein 6.9  6.0 - 8.3 (g/dL)   Albumin 3.7  3.5 - 5.2 (g/dL)   AST 14  0 - 37 (U/L)   ALT 13  0 - 35 (U/L)   Alkaline Phosphatase 75  39 - 117 (U/L)   Total Bilirubin 0.2 (*) 0.3 - 1.2 (mg/dL)   GFR calc non Af Amer 49 (*) >90 (mL/min)   GFR calc Af Amer 57 (*) >90 (mL/min)  URINALYSIS, ROUTINE W REFLEX MICROSCOPIC      Component Value Range   Color, Urine YELLOW  YELLOW    APPearance HAZY (*) CLEAR    Specific Gravity, Urine 1.012  1.005 - 1.030    pH 7.0  5.0 - 8.0    Glucose, UA NEGATIVE  NEGATIVE (mg/dL)   Hgb urine dipstick NEGATIVE  NEGATIVE    Bilirubin Urine NEGATIVE  NEGATIVE    Ketones, ur NEGATIVE  NEGATIVE (mg/dL)   Protein, ur NEGATIVE  NEGATIVE (mg/dL)   Urobilinogen, UA 0.2  0.0 - 1.0 (mg/dL)   Nitrite NEGATIVE  NEGATIVE    Leukocytes, UA NEGATIVE  NEGATIVE     Dg Chest 2 View  03/09/2011  *RADIOLOGY REPORT*  Clinical Data: Shortness of breath and cough.  CHEST - 2 VIEW  Comparison: Chest radiograph performed 01/25/2010  Findings: The lungs are well-aerated and clear.  Chronically increased interstitial markings are noted.  There is no evidence of focal opacification, pleural effusion or pneumothorax.  Bilateral nipple shadows and a likely right breast calcification are noted.  The heart is normal in size; the mediastinal contour is within normal limits.  No acute osseous abnormalities are seen. Calcification is noted along the thoracic and abdominal aorta. Clips are noted within the right upper quadrant, reflecting prior cholecystectomy.  IMPRESSION: Chronic lung changes noted; no acute cardiopulmonary process seen.  Original Report Authenticated By: Tonia Ghent, M.D.   Imaging  independently viewed by me, interpreted by radiologist.   Date: 03/10/2011  Rate: 108  Rhythm: sinus tachycardia  QRS Axis: normal  Intervals: normal  ST/T Wave abnormalities: Ventricular bigeminy  Conduction Disutrbances:none  Narrative Interpretation:   Old EKG Reviewed: NSR, left atrial enlargement, possible septal infarct   1. COPD exacerbation   2. Dyspnea       MDM  54:61 PM 71 year old female with a history of COPD on Proair and Symbicort, hypertension and stroke presenting with a three-day history of progressively worsening dyspnea that acutely worsened tonight. She has been using her proair inhaler every 4 hours for the past 3 days with some improvement in her symptoms. She endorses a mild dry cough for the past 3 days. She resides at an ALF and states multiple residents have been sick. She denies fever but states for the past few days she has had multiple loose stools per day. She denies nausea and vomiting. Patient is currently on nebulizer. She is dyspneic but able to speak in full sentences. She is not hypoxic. She has diffuse wheezing with bibasilar crackles. Suspect COPD exacerbation possibly due to viral  illness given multiple sick contacts. We'll check CBC, CMP, EKG and chest x-ray. Will treat with DuoNeb's and Solu-Medrol.  11:39 PM on reevaluation patient reports that she is feeling well and not having any difficulty breathing after DuoNeb treatment. She is in no distress and her oxygen saturations are in the upper 90s. She is requesting to go home. Her labs are without significant abnormality. Chest x-ray shows signs of chronic lung disease but no pneumonia. At this point antibiotics are not indicated. Will discharge the patient with a 5 day prednisone taper. She has plenty of proair at home. She will continue to use her proair scheduled for the next 24-48 hours. She'll also follow up with her primary physician for reevaluation. Given that she is now feeling well, her  wheezing is much improved and she is in no distress the patient is stable for discharge home. She was given strong return precautions and discharged home in stable condition. The     Sheran Luz, MD 03/09/11 2344  Sheran Luz, MD 03/10/11 [phone]

## 2011-03-10 NOTE — ED Notes (Signed)
Received report from [employer]. Currently, pt is not in any respiratory or cardiac distress. Will continue to monitor.

## 2011-03-10 NOTE — ED Provider Notes (Signed)
Evaluation and management procedures were performed by the resident physician under my supervision/collaboration.  I evaluated this patient face-to-face at the time of encounter.  Please see my note dated at that time.   Felisa Bonier, MD 03/10/11 [phone]

## 2011-03-10 NOTE — ED Provider Notes (Signed)
Evaluation and management procedures were performed by the resident physician under my supervision/collaboration.  I evaluated this patient face-to-face at the time of encounter.  Please see my note dated at that time.   Felisa Bonier, MD 03/10/11 Marlyne Beards

## 2011-09-29 ENCOUNTER — Encounter: Payer: Self-pay | Admitting: Internal Medicine

## 2012-01-09 ENCOUNTER — Emergency Department (HOSPITAL_COMMUNITY): Payer: Medicare Other

## 2012-01-09 ENCOUNTER — Inpatient Hospital Stay (HOSPITAL_COMMUNITY)
Admission: EM | Admit: 2012-01-09 | Discharge: 2012-01-14 | DRG: 193 | Disposition: A | Discharge status: Home Health | Payer: Medicare Other | Attending: Internal Medicine | Admitting: Internal Medicine

## 2012-01-09 DIAGNOSIS — I69998 Other sequelae following unspecified cerebrovascular disease: Secondary | ICD-10-CM | POA: Diagnosis present

## 2012-01-09 DIAGNOSIS — R0603 Acute respiratory distress: Secondary | ICD-10-CM

## 2012-01-09 DIAGNOSIS — J984 Other disorders of lung: Secondary | ICD-10-CM

## 2012-01-09 DIAGNOSIS — Z8673 Personal history of transient ischemic attack (TIA), and cerebral infarction without residual deficits: Secondary | ICD-10-CM

## 2012-01-09 DIAGNOSIS — I2789 Other specified pulmonary heart diseases: Secondary | ICD-10-CM | POA: Diagnosis present

## 2012-01-09 DIAGNOSIS — I5032 Chronic diastolic (congestive) heart failure: Secondary | ICD-10-CM

## 2012-01-09 DIAGNOSIS — Z7902 Long term (current) use of antithrombotics/antiplatelets: Secondary | ICD-10-CM

## 2012-01-09 DIAGNOSIS — E875 Hyperkalemia: Secondary | ICD-10-CM | POA: Diagnosis not present

## 2012-01-09 DIAGNOSIS — E872 Acidosis, unspecified: Secondary | ICD-10-CM | POA: Diagnosis present

## 2012-01-09 DIAGNOSIS — R600 Localized edema: Secondary | ICD-10-CM

## 2012-01-09 DIAGNOSIS — I517 Cardiomegaly: Secondary | ICD-10-CM

## 2012-01-09 DIAGNOSIS — F17200 Nicotine dependence, unspecified, uncomplicated: Secondary | ICD-10-CM | POA: Diagnosis present

## 2012-01-09 DIAGNOSIS — Z66 Do not resuscitate: Secondary | ICD-10-CM | POA: Diagnosis present

## 2012-01-09 DIAGNOSIS — J9601 Acute respiratory failure with hypoxia: Secondary | ICD-10-CM | POA: Diagnosis present

## 2012-01-09 DIAGNOSIS — J441 Chronic obstructive pulmonary disease with (acute) exacerbation: Secondary | ICD-10-CM

## 2012-01-09 DIAGNOSIS — I10 Essential (primary) hypertension: Secondary | ICD-10-CM

## 2012-01-09 DIAGNOSIS — R0689 Other abnormalities of breathing: Secondary | ICD-10-CM

## 2012-01-09 DIAGNOSIS — R059 Cough, unspecified: Secondary | ICD-10-CM | POA: Diagnosis present

## 2012-01-09 DIAGNOSIS — Z9981 Dependence on supplemental oxygen: Secondary | ICD-10-CM

## 2012-01-09 DIAGNOSIS — Z79899 Other long term (current) drug therapy: Secondary | ICD-10-CM

## 2012-01-09 DIAGNOSIS — J101 Influenza due to other identified influenza virus with other respiratory manifestations: Principal | ICD-10-CM | POA: Diagnosis present

## 2012-01-09 DIAGNOSIS — Z720 Tobacco use: Secondary | ICD-10-CM

## 2012-01-09 DIAGNOSIS — J9600 Acute respiratory failure, unspecified whether with hypoxia or hypercapnia: Secondary | ICD-10-CM | POA: Diagnosis present

## 2012-01-09 DIAGNOSIS — R05 Cough: Secondary | ICD-10-CM | POA: Diagnosis present

## 2012-01-09 DIAGNOSIS — R053 Chronic cough: Secondary | ICD-10-CM

## 2012-01-09 DIAGNOSIS — I959 Hypotension, unspecified: Secondary | ICD-10-CM

## 2012-01-09 DIAGNOSIS — I272 Pulmonary hypertension, unspecified: Secondary | ICD-10-CM

## 2012-01-09 DIAGNOSIS — R29898 Other symptoms and signs involving the musculoskeletal system: Secondary | ICD-10-CM | POA: Diagnosis present

## 2012-01-09 DIAGNOSIS — E860 Dehydration: Secondary | ICD-10-CM

## 2012-01-09 DIAGNOSIS — R197 Diarrhea, unspecified: Secondary | ICD-10-CM

## 2012-01-09 DIAGNOSIS — J449 Chronic obstructive pulmonary disease, unspecified: Secondary | ICD-10-CM

## 2012-01-09 DIAGNOSIS — N179 Acute kidney failure, unspecified: Secondary | ICD-10-CM | POA: Diagnosis present

## 2012-01-09 LAB — CBC WITH DIFFERENTIAL/PLATELET
Basophils Absolute: 0 10*3/uL (ref 0.0–0.1)
HCT: 45.1 % (ref 36.0–46.0)
Lymphocytes Relative: 5 % — ABNORMAL LOW (ref 12–46)
Monocytes Absolute: 0.6 10*3/uL (ref 0.1–1.0)
Neutro Abs: 8.2 10*3/uL — ABNORMAL HIGH (ref 1.7–7.7)
RDW: 14.2 % (ref 11.5–15.5)
WBC: 9.3 10*3/uL (ref 4.0–10.5)

## 2012-01-09 LAB — POCT I-STAT 3, ART BLOOD GAS (G3+)
Acid-Base Excess: 2 mmol/L (ref 0.0–2.0)
Bicarbonate: 31.4 mEq/L — ABNORMAL HIGH (ref 20.0–24.0)
Bicarbonate: 27.9 mEq/L — ABNORMAL HIGH (ref 20.0–24.0)
Patient temperature: 98.6
Patient temperature: 99.2
pH, Arterial: 7.272 — ABNORMAL LOW (ref 7.350–7.450)
pH, Arterial: 7.325 — ABNORMAL LOW (ref 7.350–7.450)

## 2012-01-09 LAB — BASIC METABOLIC PANEL
CO2: 28 mEq/L (ref 19–32)
Chloride: 98 mEq/L (ref 96–112)
Sodium: 137 mEq/L (ref 135–145)

## 2012-01-09 LAB — PROTIME-INR: Prothrombin Time: 13.3 seconds (ref 11.6–15.2)

## 2012-01-09 LAB — TROPONIN I: Troponin I: <0.3 ng/mL (ref <0.30)

## 2012-01-09 MED ORDER — IPRATROPIUM BROMIDE 0.02 % IN SOLN: 0.5000 mg | RESPIRATORY_TRACT | Status: DC | PRN

## 2012-01-09 MED ORDER — RISAQUAD PO CAPS
1.0000 | ORAL_CAPSULE | Freq: Every day | ORAL | Status: DC
  Administered 2012-01-10 – 2012-01-14 (×5): 1 via ORAL
  Filled 2012-01-09 (×5): qty 1

## 2012-01-09 MED ORDER — SODIUM CHLORIDE 0.9 % IJ SOLN: 3.0000 mL | INTRAMUSCULAR | Status: DC | PRN

## 2012-01-09 MED ORDER — ACIDOPHILUS PO CAPS: 1.0000 | ORAL_CAPSULE | Freq: Every day | ORAL | Status: DC

## 2012-01-09 MED ORDER — CLOPIDOGREL BISULFATE 75 MG PO TABS
75.0000 mg | ORAL_TABLET | Freq: Every day | ORAL | Status: DC
  Administered 2012-01-10 – 2012-01-14 (×5): 75 mg via ORAL
  Filled 2012-01-09 (×7): qty 1

## 2012-01-09 MED ORDER — METHYLPREDNISOLONE SODIUM SUCC 125 MG IJ SOLR
60.0000 mg | Freq: Two times a day (BID) | INTRAMUSCULAR | Status: DC
  Administered 2012-01-10 – 2012-01-11 (×3): 60 mg via INTRAVENOUS
  Filled 2012-01-09 (×5): qty 0.96

## 2012-01-09 MED ORDER — SENNOSIDES-DOCUSATE SODIUM 8.6-50 MG PO TABS
1.0000 | ORAL_TABLET | Freq: Every evening | ORAL | Status: DC | PRN
  Filled 2012-01-09: qty 1

## 2012-01-09 MED ORDER — SODIUM CHLORIDE 0.9 % IV SOLN: INTRAVENOUS | Status: DC

## 2012-01-09 MED ORDER — LEVOFLOXACIN IN D5W 750 MG/150ML IV SOLN
750.0000 mg | INTRAVENOUS | Status: DC
  Administered 2012-01-11: 750 mg via INTRAVENOUS
  Filled 2012-01-09: qty 150

## 2012-01-09 MED ORDER — ALBUTEROL SULFATE (5 MG/ML) 0.5% IN NEBU
INHALATION_SOLUTION | RESPIRATORY_TRACT | Status: AC
  Filled 2012-01-09: qty 0.5

## 2012-01-09 MED ORDER — VANCOMYCIN HCL IN DEXTROSE 1-5 GM/200ML-% IV SOLN
1000.0000 mg | INTRAVENOUS | Status: DC
  Administered 2012-01-10: 1000 mg via INTRAVENOUS
  Filled 2012-01-09 (×2): qty 200

## 2012-01-09 MED ORDER — ALBUTEROL SULFATE (5 MG/ML) 0.5% IN NEBU
2.5000 mg | INHALATION_SOLUTION | Freq: Once | RESPIRATORY_TRACT | Status: AC
  Administered 2012-01-09: 2.5 mg via RESPIRATORY_TRACT

## 2012-01-09 MED ORDER — CLONAZEPAM 0.5 MG PO TABS: 0.2500 mg | ORAL_TABLET | Freq: Four times a day (QID) | ORAL | Status: DC | PRN

## 2012-01-09 MED ORDER — ACETAMINOPHEN 325 MG PO TABS
650.0000 mg | ORAL_TABLET | Freq: Four times a day (QID) | ORAL | Status: DC | PRN
  Administered 2012-01-09 – 2012-01-14 (×5): 650 mg via ORAL
  Filled 2012-01-09 (×6): qty 2

## 2012-01-09 MED ORDER — LEVOFLOXACIN IN D5W 750 MG/150ML IV SOLN
750.0000 mg | Freq: Once | INTRAVENOUS | Status: AC
  Administered 2012-01-10: 750 mg via INTRAVENOUS
  Filled 2012-01-09: qty 150

## 2012-01-09 MED ORDER — ALUM & MAG HYDROXIDE-SIMETH 200-200-20 MG/5ML PO SUSP: 30.0000 mL | Freq: Four times a day (QID) | ORAL | Status: DC | PRN

## 2012-01-09 MED ORDER — HYDROCODONE-ACETAMINOPHEN 5-325 MG PO TABS: 1.0000 | ORAL_TABLET | ORAL | Status: DC | PRN

## 2012-01-09 MED ORDER — SODIUM CHLORIDE 0.9 % IV BOLUS (SEPSIS)
500.0000 mL | Freq: Once | INTRAVENOUS | Status: AC
  Administered 2012-01-09: 500 mL via INTRAVENOUS

## 2012-01-09 MED ORDER — VANCOMYCIN HCL IN DEXTROSE 1-5 GM/200ML-% IV SOLN
1000.0000 mg | Freq: Once | INTRAVENOUS | Status: AC
  Administered 2012-01-10: 1000 mg via INTRAVENOUS
  Filled 2012-01-09: qty 200

## 2012-01-09 MED ORDER — NICOTINE 14 MG/24HR TD PT24
14.0000 mg | MEDICATED_PATCH | Freq: Every day | TRANSDERMAL | Status: DC
  Administered 2012-01-10 – 2012-01-14 (×6): 14 mg via TRANSDERMAL
  Filled 2012-01-09 (×6): qty 1

## 2012-01-09 MED ORDER — GABAPENTIN 300 MG PO CAPS
300.0000 mg | ORAL_CAPSULE | Freq: Two times a day (BID) | ORAL | Status: DC
  Administered 2012-01-10 – 2012-01-14 (×9): 300 mg via ORAL
  Filled 2012-01-09 (×10): qty 1

## 2012-01-09 MED ORDER — CITALOPRAM HYDROBROMIDE 20 MG PO TABS
20.0000 mg | ORAL_TABLET | Freq: Every day | ORAL | Status: DC
  Administered 2012-01-10 – 2012-01-14 (×5): 20 mg via ORAL
  Filled 2012-01-09 (×5): qty 1

## 2012-01-09 MED ORDER — ALBUTEROL SULFATE (5 MG/ML) 0.5% IN NEBU
10.0000 mg | INHALATION_SOLUTION | Freq: Once | RESPIRATORY_TRACT | Status: AC
  Administered 2012-01-09: 10 mg via RESPIRATORY_TRACT
  Filled 2012-01-09: qty 2

## 2012-01-09 MED ORDER — IPRATROPIUM BROMIDE 0.02 % IN SOLN
0.5000 mg | Freq: Once | RESPIRATORY_TRACT | Status: AC
Start: 2012-01-09 — End: 2012-01-09
  Administered 2012-01-09: 0.5 mg via RESPIRATORY_TRACT

## 2012-01-09 MED ORDER — SODIUM CHLORIDE 0.9 % IV BOLUS (SEPSIS)
500.0000 mL | Freq: Once | INTRAVENOUS | Status: AC
  Administered 2012-01-10: 500 mL via INTRAVENOUS

## 2012-01-09 MED ORDER — ACETAMINOPHEN 650 MG RE SUPP: 650.0000 mg | Freq: Four times a day (QID) | RECTAL | Status: DC | PRN

## 2012-01-09 MED ORDER — METHYLPREDNISOLONE SODIUM SUCC 125 MG IJ SOLR
125.0000 mg | Freq: Once | INTRAMUSCULAR | Status: AC
  Administered 2012-01-09: 125 mg via INTRAVENOUS
  Filled 2012-01-09: qty 2

## 2012-01-09 MED ORDER — ALBUTEROL SULFATE (5 MG/ML) 0.5% IN NEBU
2.5000 mg | INHALATION_SOLUTION | RESPIRATORY_TRACT | Status: DC | PRN
  Administered 2012-01-11: 2.5 mg via RESPIRATORY_TRACT

## 2012-01-09 MED ORDER — ONDANSETRON HCL 4 MG PO TABS: 4.0000 mg | ORAL_TABLET | Freq: Four times a day (QID) | ORAL | Status: DC | PRN

## 2012-01-09 MED ORDER — IPRATROPIUM BROMIDE 0.02 % IN SOLN
RESPIRATORY_TRACT | Status: AC
  Filled 2012-01-09: qty 2.5

## 2012-01-09 MED ORDER — ENOXAPARIN SODIUM 40 MG/0.4ML ~~LOC~~ SOLN
40.0000 mg | SUBCUTANEOUS | Status: DC
  Administered 2012-01-10 – 2012-01-14 (×5): 40 mg via SUBCUTANEOUS
  Filled 2012-01-09 (×5): qty 0.4

## 2012-01-09 MED ORDER — ONDANSETRON HCL 4 MG/2ML IJ SOLN: 4.0000 mg | Freq: Four times a day (QID) | INTRAMUSCULAR | Status: DC | PRN

## 2012-01-09 NOTE — Progress Notes (Signed)
ANTIBIOTIC CONSULT NOTE - INITIAL  Pharmacy Consult for Vancomycin Indication: rule out pneumonia  Allergies  Allergen Reactions  . Codeine Other (See Comments)    Reaction unknown  . Penicillins Other (See Comments)    Reaction unknown    Patient Measurements: Height: 5\' 1"  (154.9 cm) Weight: 171 lb (77.565 kg) IBW/kg (Calculated) : 47.8    Vital Signs: Temp: 98.9 F (37.2 C) (12/30 1807) Temp src: Oral (12/30 1807) BP: 89/32 mmHg (12/30 2200) Pulse Rate: 124  (12/30 2200) Intake/Output from previous day:   Intake/Output from this shift:    Labs:  [facility] 01/09/12 1842  WBC 9.3  HGB 14.2  PLT 222  LABCREA --  CREATININE 1.35*   Estimated Creatinine Clearance: 37.1 ml/min (by C-G formula based on Cr of 1.35). No results found for this basename: VANCOTROUGH:2,VANCOPEAK:2,VANCORANDOM:2,GENTTROUGH:2,GENTPEAK:2,GENTRANDOM:2,TOBRATROUGH:2,TOBRAPEAK:2,TOBRARND:2,AMIKACINPEAK:2,AMIKACINTROU:2,AMIKACIN:2, in the last 72 hours   Microbiology: No results found for this or any previous visit (from the past 720 hour(s)).  Medical History: Past Medical History  Diagnosis Date  . Hypertension   . COPD (chronic obstructive pulmonary disease)   . Anemia   . Headaches, cluster   . Wears glasses   . CVA (cerebral vascular accident) 01/2010   Assessment: 71 year old female in respiratory distress, orders to start empiric antibiotics with vancomycin and levaquin. Patient has some renal impairment with a scr of 1.3, will dose adjust antibiotics accordingly. No fevers noted, wbc is normal at 9.  Goal of Therapy:  Vancomycin trough level 15-20 mcg/ml  Plan:  Measure antibiotic drug levels at steady state Follow up culture results Vancomycin 1g IV q24 hours Adjust levaquin to 750mg  IV q 48h  Carolyn [name] 01/09/2012,10:43 PM

## 2012-01-09 NOTE — ED Notes (Signed)
Per Dr. Blinda Leatherwood, pt to be placed on Bipap

## 2012-01-09 NOTE — ED Notes (Signed)
Respiratory at bedside.

## 2012-01-09 NOTE — H&P (Signed)
PCP:   Eartha Inch, MD   Chief Complaint:  Shortness of breath  HPI: This is a 71 year old female with history of COPD, not on home oxygen. She had a CVA 2 years ago and at that point she discontinued smoking. She does, however, continue to have chronic shortness of breath. In November the patient started smoking again. Her shortness of breath got worse as did her exercise stamina. Last night she developed an acute nonproductive cough and acutely worsening shortness of breath and wheeze. She felt weak. She was using her nebulizer. She fell off her bed and was unable to get up. She was found 20 minutes later. At that point she was in respiratory distress wheezing, very tachycardic. 911 was called and she was brought to the ER. In the ER the patient was satting 91% on oxygen, respiratory rate of 35. ABG was done she was hypercarbic, she was placed on BiPAP. The patient does report some nausea, vomiting, myalgia, feeling cold but no fevers. History provided by the patient as well as her daughter and ex-husband is at the bedside.  Review of Systems:  The patient denies anorexia, fever, weight loss,, vision loss, decreased hearing, hoarseness, chest pain, syncope, dyspnea on exertion, peripheral edema, balance deficits, hemoptysis, abdominal pain, melena, hematochezia, severe indigestion/heartburn, hematuria, incontinence, genital sores, muscle weakness, suspicious skin lesions, transient blindness, difficulty walking, depression, unusual weight change, abnormal bleeding, enlarged lymph nodes, angioedema, and breast masses.  Past Medical History: Past Medical History  Diagnosis Date  . Hypertension   . COPD (chronic obstructive pulmonary disease)   . Anemia   . Headaches, cluster   . Wears glasses   . CVA (cerebral vascular accident) 01/2010   Past Surgical History  Procedure Date  . Abdominal hysterectomy   . Varicose vein surgery   . Left hand   . Cholecystectomy 07/20/10    Dr Andrey Campanile;  Lap chole with ioc    Medications: Prior to Admission medications   Medication Sig Start Date End Date Taking? Authorizing Provider  acetaminophen (TYLENOL) 325 MG tablet Take 650 mg by mouth 3 (three) times daily as needed. For pain   Yes Historical Provider, MD  albuterol (PROVENTIL HFA;VENTOLIN HFA) 108 (90 BASE) MCG/ACT inhaler Inhale 2 puffs into the lungs every 4 (four) hours as needed. For shortness of breath   Yes Historical Provider, MD  albuterol (PROVENTIL) (2.5 MG/3ML) 0.083% nebulizer solution Take 2.5 mg by nebulization every 6 (six) hours as needed. For wheezing   Yes Historical Provider, MD  amLODipine (NORVASC) 10 MG tablet Take 10 mg by mouth daily.   Yes Historical Provider, MD  budesonide-formoterol (SYMBICORT) 160-4.5 MCG/ACT inhaler Inhale 2 puffs into the lungs 2 (two) times daily.   Yes Historical Provider, MD  citalopram (CELEXA) 20 MG tablet Take 20 mg by mouth daily.   Yes Historical Provider, MD  clonazePAM (KLONOPIN) 0.5 MG tablet Take 0.25 mg by mouth every 6 (six) hours as needed. For anxiety   Yes Historical Provider, MD  clopidogrel (PLAVIX) 75 MG tablet Take 75 mg by mouth daily.     Yes Historical Provider, MD  gabapentin (NEURONTIN) 300 MG capsule Take 300 mg by mouth 2 (two) times daily.   Yes Historical Provider, MD  Lactobacillus (ACIDOPHILUS PO) Take 1 tablet by mouth daily.   Yes Historical Provider, MD  loperamide (IMODIUM A-D) 2 MG tablet Take 2 mg by mouth 4 (four) times daily as needed. For diarrhea   Yes Historical Provider, MD  losartan (COZAAR)  50 MG tablet Take 75 mg by mouth daily.    Yes Historical Provider, MD  magnesium oxide (MAG-OX) 400 MG tablet Take 400 mg by mouth 2 (two) times daily.   Yes Historical Provider, MD  metoprolol succinate (TOPROL-XL) 25 MG 24 hr tablet Take 25 mg by mouth daily.   Yes Historical Provider, MD  ondansetron (ZOFRAN) 4 MG tablet Take 4 mg by mouth every 6 (six) hours as needed. For nausea   Yes Historical  Provider, MD  pravastatin (PRAVACHOL) 20 MG tablet Take 20 mg by mouth every evening.    Yes Historical Provider, MD  promethazine (PHENERGAN) 25 MG tablet Take 12.5 mg by mouth every 6 (six) hours as needed. For nausea   Yes Historical Provider, MD    Allergies:   Allergies  Allergen Reactions  . Codeine Other (See Comments)    Reaction unknown  . Penicillins Other (See Comments)    Reaction unknown    Social History:  reports that she has quit smoking. She does not have any smokeless tobacco history on file. She reports that she does not drink alcohol or use illicit drugs. lives at [facility]. Uses a cane. Is a fall risk. Patient's daughter is Alvis Lemmings [phone] (H), [phone] (C)  Family History: Family History  Problem Relation Age of Onset  . Heart disease Father   . Heart disease Mother   . Diabetes Father   . Hypertension Mother   . Cancer Maternal Aunt     ovarian, breast    Physical Exam: Filed Vitals:   01/09/12 2034 01/09/12 2045 01/09/12 2109 01/09/12 2200  BP:  99/44 99/44 89/32   Pulse:  128 128 124  Temp:      TempSrc:      Resp:  28 28 33  SpO2: 94% 96% 94% 92%    General:  Alert and oriented times three, well developed and nourished, on BiPAP Eyes: PERRLA, pink conjunctiva, no scleral icterus ENT: Moist oral mucosa, neck supple, no thyromegaly Lungs: clear to ascultation, mild wheeze, no crackles, some use of accessory muscles Cardiovascular: regular rate and rhythm, no regurgitation, no gallops, no murmurs. No carotid bruits, no JVD Abdomen: soft, positive BS, non-tender, non-distended, no organomegaly, not an acute abdomen GU: not examined Neuro: CN II - XII grossly intact, sensation intact Musculoskeletal: strength 5/5 all extremities, no clubbing, cyanosis or edema Skin: no rash, no subcutaneous crepitation, no decubitus Psych: appropriate patient   Labs on Admission:   Basename 01/09/12 1842  NA 137  K 4.6  CL 98    CO2 28  GLUCOSE 151*  BUN 15  CREATININE 1.35*  CALCIUM 10.0  MG --  PHOS --   No results found for this basename: AST:2,ALT:2,ALKPHOS:2,BILITOT:2,PROT:2,ALBUMIN:2 in the last 72 hours No results found for this basename: LIPASE:2,AMYLASE:2 in the last 72 hours  Basename 01/09/12 1842  WBC 9.3  NEUTROABS 8.2*  HGB 14.2  HCT 45.1  MCV 99.3  PLT 222    Basename 01/09/12 1846  CKTOTAL --  CKMB --  CKMBINDEX --  TROPONINI <0.30    Micro Results: No results found for this or any previous visit (from the past 240 hour(s)).   Radiological Exams on Admission: Dg Chest Portable 1 View  01/09/2012  *RADIOLOGY REPORT*  Clinical Data: 71 year old female nausea vomiting shortness of breath.  PORTABLE CHEST - 1 VIEW  Comparison: 01/06/2012 and earlier.  Findings: Portable semi upright AP view 2037 hours.  Overall lower lung volumes.  Stable  cardiac size and mediastinal contours.  No pneumothorax, pulmonary edema, pleural effusion or acute pulmonary opacity.  IMPRESSION: Lower lung volumes.  No acute cardiopulmonary abnormality.   Original Report Authenticated By: Erskine Speed, M.D.     Assessment/Plan Present on Admission:  . Acute respiratory distress Acute exacerbation of COPD Tobacco abuse Hypercapnia Patient will be admitted to step down Continue BiPAP Duo nebs every 2 hours when necessary Solu-Medrol Repeat ABG ordered Nicotine patch and tobacco cessation counseling done Antibiotics ordered  Borderline hypotension Well blood pressure medications IV fluid hydration Tachycardia/sinus rhythm Monitor in telemetry, d-dimer ordered  Beta blockers held because of hypotension plus respiratory distress. If needed will order calcium channel blockers.  Acute kidney injury Gentle IV fluid hydration History of CVA with left-sided weakness   DO NOT RESUSCITATE DVT prophylaxis   Avin Gibbons 01/09/2012, 10:11 PM

## 2012-01-09 NOTE — ED Provider Notes (Addendum)
History     CSN: [id number]  Arrival date & time 01/09/12  1807   First MD Initiated Contact with Patient 01/09/12 1812      No chief complaint on file.   (Consider location/radiation/quality/duration/timing/severity/associated sxs/prior treatment) HPI  Patient presents with cough and chest congestion since yesterday. Complains of shortness of breath. Today developed n/v/ and diarrhea. No fever.  Past Medical History  Diagnosis Date  . Hypertension   . COPD (chronic obstructive pulmonary disease)   . Anemia   . Headaches, cluster   . Wears glasses   . CVA (cerebral vascular accident) 01/2010    Past Surgical History  Procedure Date  . Abdominal hysterectomy   . Varicose vein surgery   . Left hand   . Cholecystectomy 07/20/10    Dr Andrey Campanile; Lap chole with ioc    Family History  Problem Relation Age of Onset  . Heart disease Father   . Heart disease Mother   . Diabetes Father   . Hypertension Mother   . Cancer Maternal Aunt     ovarian, breast    History  Substance Use Topics  . Smoking status: Former Games developer  . Smokeless tobacco: Not on file  . Alcohol Use: No    OB History    Grav Para Term Preterm Abortions TAB SAB Ect Mult Living                  Review of Systems  resp - cough, shortness of breath GI - n/v/d All other sytems reviewed and are negative  Allergies  Codeine and Penicillins  Home Medications   Current Outpatient Rx  Name  Route  Sig  Dispense  Refill  . ACETAMINOPHEN 325 MG PO TABS   Oral   Take 650 mg by mouth 3 (three) times daily as needed. For pain         . ALBUTEROL SULFATE HFA 108 (90 BASE) MCG/ACT IN AERS   Inhalation   Inhale 2 puffs into the lungs every 4 (four) hours as needed. For shortness of breath         . ALBUTEROL SULFATE (2.5 MG/3ML) 0.083% IN NEBU   Nebulization   Take 2.5 mg by nebulization every 6 (six) hours as needed. For wheezing         . AMLODIPINE BESYLATE 10 MG PO TABS   Oral   Take 10  mg by mouth daily.         . BUDESONIDE-FORMOTEROL FUMARATE 160-4.5 MCG/ACT IN AERO   Inhalation   Inhale 2 puffs into the lungs 2 (two) times daily.         Marland Kitchen CITALOPRAM HYDROBROMIDE 20 MG PO TABS   Oral   Take 20 mg by mouth daily.         Marland Kitchen CLONAZEPAM 0.5 MG PO TABS   Oral   Take 0.25 mg by mouth every 6 (six) hours as needed. For anxiety         . CLOPIDOGREL BISULFATE 75 MG PO TABS   Oral   Take 75 mg by mouth daily.           Marland Kitchen GABAPENTIN 300 MG PO CAPS   Oral   Take 300 mg by mouth 2 (two) times daily.         . ACIDOPHILUS PO   Oral   Take 1 tablet by mouth daily.         Marland Kitchen LOPERAMIDE HCL 2 MG PO TABS   Oral  Take 2 mg by mouth 4 (four) times daily as needed. For diarrhea         . LOSARTAN POTASSIUM 50 MG PO TABS   Oral   Take 75 mg by mouth daily.          Marland Kitchen MAGNESIUM OXIDE 400 MG PO TABS   Oral   Take 400 mg by mouth 2 (two) times daily.         Marland Kitchen METOPROLOL SUCCINATE ER 25 MG PO TB24   Oral   Take 25 mg by mouth daily.         Marland Kitchen ONDANSETRON HCL 4 MG PO TABS   Oral   Take 4 mg by mouth every 6 (six) hours as needed. For nausea         . PRAVASTATIN SODIUM 20 MG PO TABS   Oral   Take 20 mg by mouth every evening.          Marland Kitchen PROMETHAZINE HCL 25 MG PO TABS   Oral   Take 12.5 mg by mouth every 6 (six) hours as needed. For nausea           BP 102/67  Pulse 130  Temp 98.9 F (37.2 C) (Oral)  Resp 30  SpO2 98%  Physical Exam  gen - no distress heent - perrla resp - bilateral wheeze, no rhonchi or rales, no distress Card - rrr, no mumur abd - s/nt/nd Neuro - cn ii-xii intact, no focal deficit Skin - no rash Psych - normal mood   ED Course  Procedures (including critical care time)   Date: 01/09/2012  Rate: 128  Rhythm: sinus tachycardia  QRS Axis: normal  Intervals: normal  ST/T Wave abnormalities: nonspecific ST/T changes  Conduction Disutrbances:none  Narrative Interpretation:   Old EKG Reviewed:  unchanged     Labs Reviewed  CBC WITH DIFFERENTIAL  BASIC METABOLIC PANEL  TROPONIN I  PROTIME-INR  CULTURE, BLOOD (ROUTINE X 2)  CULTURE, BLOOD (ROUTINE X 2)   Dg Chest Portable 1 View  01/09/2012  *RADIOLOGY REPORT*  Clinical Data: 71 year old female nausea vomiting shortness of breath.  PORTABLE CHEST - 1 VIEW  Comparison: 01/06/2012 and earlier.  Findings: Portable semi upright AP view 2037 hours.  Overall lower lung volumes.  Stable cardiac size and mediastinal contours.  No pneumothorax, pulmonary edema, pleural effusion or acute pulmonary opacity.  IMPRESSION: Lower lung volumes.  No acute cardiopulmonary abnormality.   Original Report Authenticated By: Erskine Speed, M.D.      1. COPD (chronic obstructive pulmonary disease)       MDM  Patient brought to the ER and significant respiratory distress. Patient does have a history of COPD. She is started the fever has increased cough since yesterday. Patient was very tachypneic, hypoxic and in distress on arrival. Patient was started on continuous nebulizer and a blood gas was obtained. Results of the blood tests revealed CO2 retention with acute respiratory acidosis. Because of the patient's level of distress and her blood gas results, I decided to place the patient on BiPAP. For some reason, the respiratory therapist argued with me whether or not the patient needed BiPAP. I reexamined the patient, she is still breathing 31 times a minute, wheezing and looks uncomfortable. I ordered BiPAP once again. Patient appears to be tolerating it well. She will be admitted for further treatment.        Gilda Crease, MD 01/09/12 2117  Gilda Crease, MD 01/25/12 [phone]

## 2012-01-09 NOTE — ED Notes (Signed)
To ED via [facility] medic 63 with  C/o cough, shortness of breath, body aches, started yesterday. With nausea/vomiting/diarrhea today-- lives at [facility] [place]--assisted living.

## 2012-01-10 ENCOUNTER — Encounter (HOSPITAL_COMMUNITY): Payer: Self-pay | Admitting: *Deleted

## 2012-01-10 DIAGNOSIS — R600 Localized edema: Secondary | ICD-10-CM | POA: Diagnosis not present

## 2012-01-10 DIAGNOSIS — E860 Dehydration: Secondary | ICD-10-CM | POA: Diagnosis present

## 2012-01-10 DIAGNOSIS — I959 Hypotension, unspecified: Secondary | ICD-10-CM | POA: Diagnosis present

## 2012-01-10 DIAGNOSIS — R197 Diarrhea, unspecified: Secondary | ICD-10-CM | POA: Diagnosis present

## 2012-01-10 DIAGNOSIS — I517 Cardiomegaly: Secondary | ICD-10-CM

## 2012-01-10 DIAGNOSIS — J449 Chronic obstructive pulmonary disease, unspecified: Secondary | ICD-10-CM

## 2012-01-10 DIAGNOSIS — I272 Pulmonary hypertension, unspecified: Secondary | ICD-10-CM | POA: Diagnosis present

## 2012-01-10 DIAGNOSIS — I5032 Chronic diastolic (congestive) heart failure: Secondary | ICD-10-CM

## 2012-01-10 DIAGNOSIS — R05 Cough: Secondary | ICD-10-CM | POA: Diagnosis present

## 2012-01-10 DIAGNOSIS — R0689 Other abnormalities of breathing: Secondary | ICD-10-CM | POA: Diagnosis present

## 2012-01-10 DIAGNOSIS — J9600 Acute respiratory failure, unspecified whether with hypoxia or hypercapnia: Secondary | ICD-10-CM

## 2012-01-10 DIAGNOSIS — R053 Chronic cough: Secondary | ICD-10-CM | POA: Diagnosis present

## 2012-01-10 LAB — MRSA PCR SCREENING: MRSA by PCR: NEGATIVE

## 2012-01-10 LAB — BASIC METABOLIC PANEL
CO2: 26 mEq/L (ref 19–32)
Calcium: 9.2 mg/dL (ref 8.4–10.5)
GFR calc Af Amer: 38 mL/min — ABNORMAL LOW (ref >90)
GFR calc non Af Amer: 33 mL/min — ABNORMAL LOW (ref >90)
Sodium: 137 mEq/L (ref 135–145)

## 2012-01-10 LAB — CBC
MCV: 98.9 fL (ref 78.0–100.0)
Platelets: 189 10*3/uL (ref 150–400)
RBC: 3.73 MIL/uL — ABNORMAL LOW (ref 3.87–5.11)
RDW: 14.4 % (ref 11.5–15.5)
WBC: 6.5 10*3/uL (ref 4.0–10.5)

## 2012-01-10 MED ORDER — BUDESONIDE-FORMOTEROL FUMARATE 160-4.5 MCG/ACT IN AERO
2.0000 | INHALATION_SPRAY | Freq: Two times a day (BID) | RESPIRATORY_TRACT | Status: DC
  Administered 2012-01-10 – 2012-01-11 (×3): 2 via RESPIRATORY_TRACT
  Filled 2012-01-10: qty 6

## 2012-01-10 MED ORDER — ALBUTEROL SULFATE (5 MG/ML) 0.5% IN NEBU
2.5000 mg | INHALATION_SOLUTION | Freq: Four times a day (QID) | RESPIRATORY_TRACT | Status: DC
  Administered 2012-01-10 – 2012-01-11 (×5): 2.5 mg via RESPIRATORY_TRACT
  Filled 2012-01-10 (×7): qty 0.5

## 2012-01-10 MED ORDER — SODIUM CHLORIDE 0.9 % IV BOLUS (SEPSIS)
500.0000 mL | Freq: Once | INTRAVENOUS | Status: AC
  Administered 2012-01-10: 500 mL via INTRAVENOUS

## 2012-01-10 NOTE — Progress Notes (Signed)
[facility] Progress Note [place] TEAM 1 - Stepdown/ICU TEAM   Carolyn [name] WNU:[id number] DOB: 1940/12/09 DOA: 01/09/2012 PCP: Eartha Inch, MD  Brief narrative: 71 year old female patient who is a resident of skilled nursing facility. She has long-standing COPD but does not require oxygen. She has recently resumed smoking after stopping 2 years ago. She's had chronic dyspnea on exertion which appears to have worsened over the past 12 months. She was sent to the emergency department after developing acute nonproductive cough and progressive shortness of breath with severe wheezing and weakness. The symptoms did not improve despite utilizing her nebulizer treatment in the ER her saturations were 91% on oxygen and respiratory rate was 35. ABG revealed hypercarbia. She was subsequently placed on BiPAP. Patient denied nausea and vomiting myalgias or fevers prior to admission. Because of the requirement for BiPAP she was subsequently admitted to the step down unit during the night patient developed hypotension and required IV fluid challenges.  Assessment/Plan: Principal Problem:  *Acute respiratory failure with hypoxia and Hypercapnia *Seems to be primarily related to acute COPD exacerbation-she has been successfully weaned from BiPAP to nasal cannula oxygen but very dyspneic on exertion *She has not had any high fevers, headaches or other typical symptoms for influenza PCR has not been checked *After our exam the RN notified us that she felt the patient was having crackles so as precaution IV fluids were decreased to keep open-see below regarding history of diastolic heart failure-we have also added incentive spirometry -  crackles likely are not indicative of volume overload but more reflective of bronchitis  Active Problems:  COPD exacerbation/Tobacco abuse *Seems to be primary etiology to patient's symptoms and possible underlying bronchitis *She is afebrile and has no  leukocytosis so infectious etiology less likely-consider discontinuation of antibiotics-see below regarding concerns of C. difficile colitis *Is not on chronic prednisone *Resume inhaled steroid and continue IV Solu-Medrol for an additional 24 hours before tapering *Continue nebulizers    Dehydration/Diarrhea *Patient has been having diarrhea nonbloody for one week. Since she is a resident of a skilled nursing facility concerns are that she may have C. difficile colitis so stool PCR has been requested *About one week ago this patient was started on Lasix for lower extremity edema and suspect the Lasix in the setting of ongoing diarrhea precipitated dehydration  Acute renal failure *Baseline creatinine 2012 0.7 and in February 2013 was 1.13 which is reflective of stage III chronic kidney disease-current creatinine elevated at 1.57 with high normal BUN   Hypotension *Felt to be primarily related to volume depletion-is now normotensive and eating a diet so IV fluids decreased to keep open *Continue to hold antihypertensive medications   recent Bilateral lower extremity edema *Was empirically started on Lasix which currently is on hold-suspect may have been precipitated by right-sided heart failure (see below)   RVH (right ventricular hypertrophy)/Pulmonary HTN *Appears to be compensated-was on Norvasc prior to admission   Chronic diastolic heart failure, NYHA class 1 *Appears compensated but given associated right ventricular hypertrophy and pulmonary hypertension must be careful with IV fluid hydration since suspect could easily transition from dry and compensated to volume overloaded   H/O: CVA (cerebrovascular accident) *Continue Plavix   Hypertension *See above regarding recent hypotension   Chronic cough *Patient has a chronic wet cough productive with clear sputum for many years and this has been unchanged even with recent dyspnea  DVT prophylaxis: Lovenox Code Status:  Full Family Communication: Spoke with patient and her daughter  Disposition Plan: Remain in step down  Consultants: None  Procedures: None  Antibiotics: Levaquin 12/30 >>> Vancomycin 12/30 >>>  HPI/Subjective: Agent alert and sitting upright on side in bed- endorsing feels much better than prior to admission. Still with chronic cough. Clarifies has been having diarrhea prior to admission and this continues. Denies chest pain. No shortness of breath at rest   Objective: Blood pressure 122/76, pulse 107, temperature 97.8 F (36.6 C), temperature source Oral, resp. rate 22, height 5\' 1"  (1.549 m), weight 77.565 kg (171 lb), SpO2 97.00%.  Intake/Output Summary (Last 24 hours) at 01/10/12 1332 Last data filed at 01/10/12 0800  Gross per 24 hour  Intake 833.75 ml  Output      1 ml  Net 832.75 ml     Exam: General: No acute respiratory distress Lungs: Coarse to auscultation bilaterally with coarse expiratory wheezes with adequate air movement although somewhat diminished in the bases bilaterally, nasal cannula oxygen, still becomes slightly tachypneic and short-winded with talking but effort is nonlabored Cardiovascular: Regular rate and rhythm without murmur gallop or rub normal S1 and S2, no edema, no JVD, IV fluids at 75 cc per hour-decreased to keep the Abdomen: Nontender, nondistended, soft, bowel sounds positive, no rebound, no ascites, no appreciable mass Musculoskeletal: No significant cyanosis, clubbing of  bilateral lower extremities Neurological: Alert and oriented x3, moves all extremities x4, exam non-focal  Data Reviewed: Basic Metabolic Panel:  Lab 01/10/12 1610 01/09/12 1842  NA 137 137  K 4.7 4.6  CL 104 98  CO2 26 28  GLUCOSE 121* 151*  BUN 19 15  CREATININE 1.57* 1.35*  CALCIUM 9.2 10.0  MG -- --  PHOS -- --   Liver Function Tests: No results found for this basename: AST:5,ALT:5,ALKPHOS:5,BILITOT:5,PROT:5,ALBUMIN:5 in the last 168 hours No results  found for this basename: LIPASE:5,AMYLASE:5 in the last 168 hours No results found for this basename: AMMONIA:5 in the last 168 hours CBC:  Lab 01/10/12 0508 01/09/12 1842  WBC 6.5 9.3  NEUTROABS -- 8.2*  HGB 12.0 14.2  HCT 36.9 45.1  MCV 98.9 99.3  PLT 189 222   Cardiac Enzymes:  Lab 01/09/12 1846  CKTOTAL --  CKMB --  CKMBINDEX --  TROPONINI <0.30   BNP (last 3 results) No results found for this basename: PROBNP:3 in the last 8760 hours CBG: No results found for this basename: GLUCAP:5 in the last 168 hours  Recent Results (from the past 240 hour(s))  MRSA PCR SCREENING     Status: Normal   Collection Time   01/09/12 11:49 PM      Component Value Range Status Comment   MRSA by PCR NEGATIVE  NEGATIVE Final      Studies:  Recent x-ray studies have been reviewed in detail by the Attending Physician  Scheduled Meds:  Reviewed in detail by the Attending Physician   Junious Silk, ANP [facility] Office  [phone] Pager [phone]  On-Call/Text Page:      [email or web address]      password [email or web address]  If 7PM-7AM, please contact night-coverage [email or web address] Password [email or web address] 01/10/2012, 1:32 PM   LOS: 1 day   I have examined the patient, reviewed the chart and agree with the above note which I have modified.   Calvert Cantor, MD [phone]

## 2012-01-10 NOTE — Progress Notes (Signed)
Utilization Review Completed.   Rawleigh Rode, RN, BSN Nurse Case Manager  [phone]  

## 2012-01-10 NOTE — Progress Notes (Signed)
BP 84/43.  HR 100-110.  Pt has no complaints.  No shortness of breath or wheezing.  Lungs sound diminished.  MD on call notified of low BP.  Ordered another 500cc bolus NS.  Post bolus BP = 109/48.  Will continue to monitor.    Maximino Greenland RN

## 2012-01-10 NOTE — Clinical Documentation Improvement (Signed)
Acidosis Alkalosis Clarification  THIS DOCUMENT IS NOT A PERMANENT PART OF THE MEDICAL RECORD  Please update your documentation within the medical record to reflect your response to this query.                                                                                   01/10/12  Dear Dr. Betti Cruz,  In a better effort to capture your patient's severity of illness, reflect appropriate length of stay and utilization of resources, a review of the patient medical record has revealed the following indicators.   Based on your clinical judgment, please clarify and document in a progress note and/or discharge summary the clinical condition associated with the following supporting information: In responding to this query please exercise your independent judgment.  The fact that a query is asked, does not imply that any particular answer is desired or expected.   Carolyn [name] was admitted with Acute COPD exacerbation and Acute Respiratory Distress. Per ABG resluts:  Component     Latest Ref Rng 01/09/2012 01/09/2012        7:31 PM 10:46 PM  pH, Arterial     7.350 - 7.450 7.272 (L) 7.325 (L)  pCO2 arterial     35.0 - 45.0 mmHg 68.1 (HH) 53.7 (H)  pO2, Arterial     80.0 - 100.0 mmHg 211.0 (H) 92.0  Bicarbonate     20.0 - 24.0 mEq/L 31.4 (H) 27.9 (H)  TCO2     0 - 100 mmol/L 33 29  O2 Saturation      100.0 96.0   If possible, please help clarify the suspected diagnosis causing these abnormal laboratory results. Thank you!  Possible Clinical Conditions?  - Respiratory Acidosis  - Other condition (please document in the progress notes and/or discharge summary)  - Cannot Clinically determine at this time   Supporting Information:  - Bipap        Reviewed: additional documentation in the medical record    Thank You,  Saul Fordyce  Clinical Documentation Specialist: [phone] Pager  Health Information Management [place]

## 2012-01-10 NOTE — Progress Notes (Signed)
Pt admitted to 3303 at 2315.  Pt alert and oriented, no complaints.  BiPAP on, O2 sat 95%, resp rate 22-27, HR 110s, BP 82/42.  Rechecked BP- 77/33.  MD on call notified.  Ordered 500cc bolus NS.  Post bolus BP= 97/39.  Will continue to monitor.   Also questioned pt and daughter about DNR status, gave more information, and pt wants to be full code.  Daughter spoke to MD about change in status.    Maximino Greenland RN

## 2012-01-10 NOTE — Progress Notes (Signed)
Noted more crackles on ausculation  , notified MD

## 2012-01-11 DIAGNOSIS — I2789 Other specified pulmonary heart diseases: Secondary | ICD-10-CM

## 2012-01-11 LAB — CLOSTRIDIUM DIFFICILE BY PCR: Toxigenic C. Difficile by PCR: NEGATIVE

## 2012-01-11 MED ORDER — SIMVASTATIN 10 MG PO TABS
10.0000 mg | ORAL_TABLET | Freq: Every day | ORAL | Status: DC
  Administered 2012-01-11 – 2012-01-13 (×3): 10 mg via ORAL
  Filled 2012-01-11 (×5): qty 1

## 2012-01-11 MED ORDER — METHYLPREDNISOLONE SODIUM SUCC 125 MG IJ SOLR
60.0000 mg | Freq: Three times a day (TID) | INTRAMUSCULAR | Status: DC
  Administered 2012-01-11 – 2012-01-12 (×4): 60 mg via INTRAVENOUS
  Filled 2012-01-11 (×7): qty 0.96

## 2012-01-11 MED ORDER — LEVALBUTEROL HCL 0.63 MG/3ML IN NEBU
0.6300 mg | INHALATION_SOLUTION | RESPIRATORY_TRACT | Status: DC | PRN
  Filled 2012-01-11: qty 3

## 2012-01-11 MED ORDER — OXYCODONE HCL 5 MG PO TABS: 5.0000 mg | ORAL_TABLET | ORAL | Status: DC | PRN

## 2012-01-11 MED ORDER — LEVALBUTEROL HCL 0.63 MG/3ML IN NEBU
0.6300 mg | INHALATION_SOLUTION | Freq: Four times a day (QID) | RESPIRATORY_TRACT | Status: DC
  Administered 2012-01-11 – 2012-01-14 (×8): 0.63 mg via RESPIRATORY_TRACT
  Filled 2012-01-11 (×17): qty 3

## 2012-01-11 MED ORDER — WHITE PETROLATUM GEL
Status: AC
  Administered 2012-01-11: 10:00:00
  Filled 2012-01-11: qty 5

## 2012-01-11 MED ORDER — IPRATROPIUM BROMIDE 0.02 % IN SOLN
0.5000 mg | Freq: Four times a day (QID) | RESPIRATORY_TRACT | Status: DC
  Administered 2012-01-11: 0.5 mg via RESPIRATORY_TRACT
  Filled 2012-01-11 (×2): qty 2.5

## 2012-01-11 MED ORDER — METOPROLOL SUCCINATE ER 25 MG PO TB24
25.0000 mg | ORAL_TABLET | Freq: Every day | ORAL | Status: DC
  Administered 2012-01-11 – 2012-01-14 (×4): 25 mg via ORAL
  Filled 2012-01-11 (×4): qty 1

## 2012-01-11 MED ORDER — IPRATROPIUM BROMIDE 0.02 % IN SOLN
0.5000 mg | Freq: Four times a day (QID) | RESPIRATORY_TRACT | Status: DC
  Administered 2012-01-11 – 2012-01-14 (×9): 0.5 mg via RESPIRATORY_TRACT
  Filled 2012-01-11 (×10): qty 2.5

## 2012-01-11 MED ORDER — AMLODIPINE BESYLATE 10 MG PO TABS
10.0000 mg | ORAL_TABLET | Freq: Every day | ORAL | Status: DC
  Administered 2012-01-11 – 2012-01-14 (×4): 10 mg via ORAL
  Filled 2012-01-11 (×4): qty 1

## 2012-01-11 NOTE — Progress Notes (Signed)
[facility] Progress Note [facility] TEAM 1 - Stepdown/ICU TEAM   Carolyn [name] ZOX:[id number] DOB: 04-25-1940 DOA: 01/09/2012 PCP: Eartha Inch, MD  Brief narrative: 72 year old female patient who is a resident of skilled nursing facility. She has long-standing COPD but does not require oxygen. She has recently resumed smoking after stopping 2 years ago. She's had chronic dyspnea on exertion which appears to have worsened over the past 12 months. She was sent to the emergency department after developing acute nonproductive cough and progressive shortness of breath with severe wheezing and weakness. The symptoms did not improve despite utilizing her nebulizer treatment.  In the ER her saturations were 91% on oxygen and respiratory rate was 35. ABG revealed hypercarbia. She was subsequently placed on BiPAP. Patient denied nausea and vomiting myalgias or fevers prior to admission. Because of the requirement for BiPAP she was subsequently admitted to the step down unit.  During the night patient developed hypotension and required IV fluid challenges.  Assessment/Plan:  Acute respiratory failure with hypoxia and Hypercapnia *Seems to be primarily related to acute COPD exacerbation - she has been successfully weaned from BiPAP to nasal cannula oxygen *She has not had any fevers, headaches or other typical symptoms for influenza - nonetheless, in that pt is in high risk population and presented w/ resp difficulty, will check PCR   COPD exacerbation / Tobacco abuse *Seems to be primary etiology of patient's symptoms and possible underlying bronchitis *She is afebrile and has no leukocytosis  *Is not on chronic prednisone *IV Solu-Medrol tapering *Continue nebulizers *would benefit from referral to Pulmonology as outpt after D/C   Dehydration/Diarrhea C diff negative - follow sx - denies any further today   Acute renal failure *Baseline creatinine 2012 0.7 and in February 2013 was 1.13  which is reflective of stage III chronic kidney disease - current creatinine elevated at 1.57 with high normal BUN recheck in AM  Hypotension *Felt to be primarily related to volume depletion - is now normotensive and eating a diet so IV fluids decreased to keep open *Continue to hold antihypertensive medications  recent Bilateral lower extremity edema *Was empirically started on Lasix which currently is on hold - suspect may have been precipitated by right-sided heart failure (see below)  RVH (right ventricular hypertrophy)/Pulmonary HTN *Appears to be compensated - was on Norvasc prior to admission  Chronic diastolic heart failure, NYHA class 1 *Appears compensated but given associated right ventricular hypertrophy and pulmonary hypertension must be careful with IV fluid hydration since suspect could easily transition from dry and compensated to volume overloaded  H/O: CVA (cerebrovascular accident) *Continue Plavix  Hx of Hypertension Controlled at present   Chronic cough *Patient has a chronic wet cough productive of clear sputum for many years and this has been unchanged even with recent dyspnea  DVT prophylaxis: Lovenox Code Status: Full Disposition Plan: stable for tele bed  Consultants: None  Procedures: None  Antibiotics: Levaquin 12/30 >>> Vancomycin 12/30 >>>01/11/2012  HPI/Subjective: Resting comfortably in bed at the time of my visit.  The patient is able to lay flat with no dyspnea.  She states that she is approaching her baseline.  She is anxious to be allowed to get up and attempt to ambulate.  She denies chest pain nausea vomiting or diarrhea today.   Objective: Blood pressure 133/74, pulse 98, temperature 98.1 F (36.7 C), temperature source Oral, resp. rate 24, height 5\' 1"  (1.549 m), weight 77.565 kg (171 lb), SpO2 92.00%.  Intake/Output Summary (Last  24 hours) at 01/11/12 1326 Last data filed at 01/11/12 0800  Gross per 24 hour  Intake   1290 ml    Output    900 ml  Net    390 ml     Exam: General: No acute respiratory distress at rest Lungs: very distant breath sounds - diffuse exp wheeze noted - pt is able to complete full sentence, but can't string 2 sentences together Cardiovascular: very distant HS - RRR w/o appreciable M Abdomen: Nontender, nondistended, soft, bowel sounds positive, no rebound, no ascites, no appreciable mass Musculoskeletal: No significant cyanosis, clubbing of  bilateral lower extremities Neurological: Alert and oriented x3, moves all extremities x4, exam non-focal  Data Reviewed: Basic Metabolic Panel:  Lab 01/10/12 1610 01/09/12 1842  NA 137 137  K 4.7 4.6  CL 104 98  CO2 26 28  GLUCOSE 121* 151*  BUN 19 15  CREATININE 1.57* 1.35*  CALCIUM 9.2 10.0  MG -- --  PHOS -- --   CBC:  Lab 01/10/12 0508 01/09/12 1842  WBC 6.5 9.3  NEUTROABS -- 8.2*  HGB 12.0 14.2  HCT 36.9 45.1  MCV 98.9 99.3  PLT 189 222   Cardiac Enzymes:  Lab 01/09/12 1846  CKTOTAL --  CKMB --  CKMBINDEX --  TROPONINI <0.30    Recent Results (from the past 240 hour(s))  CULTURE, BLOOD (ROUTINE X 2)     Status: Normal (Preliminary result)   Collection Time   01/09/12  6:47 PM      Component Value Range Status Comment   Specimen Description BLOOD ARM RIGHT   Final    Special Requests     Final    Value: BOTTLES DRAWN AEROBIC AND ANAEROBIC BLUE 10CC RED 5CC   Culture  Setup Time 01/10/2012 01:42   Final    Culture     Final    Value:        BLOOD CULTURE RECEIVED NO GROWTH TO DATE CULTURE WILL BE HELD FOR 5 DAYS BEFORE ISSUING A FINAL NEGATIVE REPORT   Report Status PENDING   Incomplete   CULTURE, BLOOD (ROUTINE X 2)     Status: Normal (Preliminary result)   Collection Time   01/09/12  7:00 PM      Component Value Range Status Comment   Specimen Description BLOOD HAND RIGHT   Final    Special Requests BOTTLES DRAWN AEROBIC ONLY 10CC   Final    Culture  Setup Time 01/10/2012 01:42   Final    Culture     Final     Value:        BLOOD CULTURE RECEIVED NO GROWTH TO DATE CULTURE WILL BE HELD FOR 5 DAYS BEFORE ISSUING A FINAL NEGATIVE REPORT   Report Status PENDING   Incomplete   MRSA PCR SCREENING     Status: Normal   Collection Time   01/09/12 11:49 PM      Component Value Range Status Comment   MRSA by PCR NEGATIVE  NEGATIVE Final   CLOSTRIDIUM DIFFICILE BY PCR     Status: Normal   Collection Time   01/11/12 10:19 AM      Component Value Range Status Comment   C difficile by pcr NEGATIVE  NEGATIVE Final      Studies:  Recent x-ray studies have been reviewed in detail by the Attending Physician  Scheduled Meds:  Reviewed in detail by the Attending Physician   Lonia Blood, MD [facility] Office  [phone] Pager  [phone]  On-Call/Text Page:      [email or web address]      password [facility]  01/11/2012, 1:26 PM   LOS: 2 days

## 2012-01-11 NOTE — Progress Notes (Signed)
Report called to [facility], patient without complaints. Patient states daughter knows she is being transferred and will notify her of room number.

## 2012-01-12 DIAGNOSIS — I10 Essential (primary) hypertension: Secondary | ICD-10-CM

## 2012-01-12 LAB — BASIC METABOLIC PANEL
BUN: 24 mg/dL — ABNORMAL HIGH (ref 6–23)
CO2: 29 mEq/L (ref 19–32)
Calcium: 9.9 mg/dL (ref 8.4–10.5)
Chloride: 102 mEq/L (ref 96–112)
Creatinine, Ser: 0.8 mg/dL (ref 0.50–1.10)
GFR calc Af Amer: 64 mL/min — ABNORMAL LOW (ref >90)
GFR calc Af Amer: 85 mL/min — ABNORMAL LOW (ref >90)
GFR calc non Af Amer: 55 mL/min — ABNORMAL LOW (ref >90)
GFR calc non Af Amer: 74 mL/min — ABNORMAL LOW (ref >90)
Potassium: 5.4 mEq/L — ABNORMAL HIGH (ref 3.5–5.1)
Sodium: 142 mEq/L (ref 135–145)
Sodium: 138 mEq/L (ref 135–145)

## 2012-01-12 LAB — INFLUENZA PANEL BY PCR (TYPE A & B)
H1N1 flu by pcr: DETECTED — AB
Influenza B By PCR: NEGATIVE

## 2012-01-12 MED ORDER — SODIUM POLYSTYRENE SULFONATE 15 GM/60ML PO SUSP
30.0000 g | Freq: Once | ORAL | Status: AC
  Administered 2012-01-12: 30 g via ORAL
  Filled 2012-01-12: qty 120

## 2012-01-12 MED ORDER — INSULIN REGULAR HUMAN 100 UNIT/ML IJ SOLN
10.0000 [IU] | Freq: Once | INTRAMUSCULAR | Status: DC
  Filled 2012-01-12: qty 0.1

## 2012-01-12 MED ORDER — DEXTROSE 50 % IV SOLN
25.0000 mL | Freq: Once | INTRAVENOUS | Status: AC
  Administered 2012-01-12: 25 mL via INTRAVENOUS
  Filled 2012-01-12: qty 50

## 2012-01-12 MED ORDER — LEVOFLOXACIN 750 MG PO TABS: 750.0000 mg | ORAL_TABLET | ORAL | Status: DC

## 2012-01-12 MED ORDER — INSULIN ASPART 100 UNIT/ML ~~LOC~~ SOLN
10.0000 [IU] | Freq: Once | SUBCUTANEOUS | Status: AC
  Administered 2012-01-12: 10 [IU] via INTRAVENOUS

## 2012-01-12 MED ORDER — INSULIN ASPART 100 UNIT/ML IV SOLN: 10.0000 [IU] | Freq: Once | INTRAVENOUS | Status: DC

## 2012-01-12 MED ORDER — PREDNISONE 20 MG PO TABS
40.0000 mg | ORAL_TABLET | Freq: Every day | ORAL | Status: DC
  Administered 2012-01-12 – 2012-01-14 (×3): 40 mg via ORAL
  Filled 2012-01-12 (×6): qty 2

## 2012-01-12 NOTE — Progress Notes (Signed)
[facility] PROGRESS NOTE  LEOLIA VINZANT RUE:[id number] DOB: 29-Sep-1940 DOA: 01/09/2012 PCP: Eartha Inch, MD  Brief narrative:  72 year old female patient who is a resident of skilled nursing facility. She has long-standing COPD but does not require oxygen. She has recently resumed smoking after stopping 2 years ago. She's had chronic dyspnea on exertion which appears to have worsened over the past 12 months. She was sent to the emergency department after developing acute nonproductive cough and progressive shortness of breath with severe wheezing and weakness. The symptoms did not improve despite utilizing her nebulizer treatment. In the ER her saturations were 91% on oxygen and respiratory rate was 35. ABG revealed hypercarbia. She was subsequently placed on BiPAP. Patient denied nausea and vomiting myalgias or fevers prior to admission. Because of the requirement for BiPAP she was subsequently admitted to the step down unit. During the night patient developed hypotension and required IV fluid challenges.   Assessment/Plan:  Acute respiratory failure with hypoxia and Hypercapnia (respiratory acidosis) / influenza A and H1N1 positive *Seems to be primarily related to acute COPD exacerbation - she has been successfully weaned from BiPAP to nasal cannula oxygen. *Likely triggered by influenza.  As patient feeling better continue supportive care.  Patient out of therapeutic window for Tamiflu, if patient becomes hemodynamically unstable consider starting Tamiflu at that time.  COPD exacerbation / Tobacco abuse  *Seems to be primary etiology of patient's symptoms and possible underlying bronchitis  *She is afebrile and has no leukocytosis  *Is not on chronic prednisone  *IV Solu-Medrol, transition to prednisone. *Continue nebulizers  *would benefit from referral to Pulmonology as outpt after D/C. *Discontinue levofloxacin,  Dehydration/Diarrhea  *C diff negative - follow sx - denies  any further today.   Acute renal failure  *Baseline creatinine 2012 0.7 and in February 2013 was 1.13 which is reflective of stage III chronic kidney disease - current creatinine elevated at 1.57 with high normal BUN recheck in AM.  Resolved with IV hydration.  Hypotension  *Felt to be primarily related to volume depletion - is now normotensive and eating a diet so IV fluids decreased to keep open  *Resolved.  Hyperkalemia Give the patient Kayexalate with insulin and D50, continue to monitor.  Recheck electrolytes tomorrow.  Recent bilateral lower extremity edema  *Was empirically started on Lasix which has been discontinued. Suspect may have been precipitated by right-sided heart failure (see below).  RVH (right ventricular hypertrophy)/Pulmonary HTN  *Appears to be compensated - was on Norvasc prior to admission   Chronic diastolic heart failure, NYHA class 1  *Appears compensated but given associated right ventricular hypertrophy and pulmonary hypertension must be careful with IV fluid hydration since suspect could easily transition from dry and compensated to volume overloaded   H/O: CVA (cerebrovascular accident)  *Continue Plavix   Hx of Hypertension  Controlled at present   Chronic cough  *Patient has a chronic wet cough productive of clear sputum for many years and this has been unchanged even with recent dyspnea.  Generalized weakness Appreciate PT evaluation, pending.  DVT prophylaxis: Lovenox  Code Status: Full  Disposition Plan: Consider discharge in the next 1 to 2 days  Consultants:  None  Procedures:  None  Antibiotics:  Levaquin 12/30 >>> 01/12/2012 Vancomycin 12/30 >>>01/11/2012  HPI/Subjective: Feeling better today.  Wondering if she can go home tomorrow.  Objective: Filed Vitals:   01/12/12 0156 01/12/12 0525 01/12/12 0755 01/12/12 1001  BP:  125/70  148/82  Pulse:  95  103  Temp:  98.1 F (36.7 C)    TempSrc:  Oral    Resp:  20    Height:        Weight:      SpO2: 97% 98% 96%     Intake/Output Summary (Last 24 hours) at 01/12/12 1252 Last data filed at 01/11/12 1600  Gross per 24 hour  Intake    520 ml  Output      0 ml  Net    520 ml   Filed Weights   01/09/12 2200  Weight: 77.565 kg (171 lb)    Exam: Physical Exam: General: Awake, Oriented, No acute distress. HEENT: EOMI. Neck: Supple CV: S1 and S2 Lungs: Clear to ascultation bilaterally Abdomen: Soft, Nontender, Nondistended, +bowel sounds. Ext: Good pulses. Trace edema.  Data Reviewed: Basic Metabolic Panel:  Lab 01/12/12 1914 01/10/12 0508 01/09/12 1842  NA 142 137 137  K 5.4* 4.7 4.6  CL 102 104 98  CO2 35* 26 28  GLUCOSE 121* 121* 151*  BUN 24* 19 15  CREATININE 1.01 1.57* 1.35*  CALCIUM 9.9 9.2 10.0  MG -- -- --  PHOS -- -- --   Liver Function Tests: No results found for this basename: AST:5,ALT:5,ALKPHOS:5,BILITOT:5,PROT:5,ALBUMIN:5 in the last 168 hours No results found for this basename: LIPASE:5,AMYLASE:5 in the last 168 hours No results found for this basename: AMMONIA:5 in the last 168 hours CBC:  Lab 01/10/12 0508 01/09/12 1842  WBC 6.5 9.3  NEUTROABS -- 8.2*  HGB 12.0 14.2  HCT 36.9 45.1  MCV 98.9 99.3  PLT 189 222   Cardiac Enzymes:  Lab 01/09/12 1846  CKTOTAL --  CKMB --  CKMBINDEX --  TROPONINI <0.30   BNP (last 3 results) No results found for this basename: PROBNP:3 in the last 8760 hours CBG: No results found for this basename: GLUCAP:5 in the last 168 hours  Recent Results (from the past 240 hour(s))  CULTURE, BLOOD (ROUTINE X 2)     Status: Normal (Preliminary result)   Collection Time   01/09/12  6:47 PM      Component Value Range Status Comment   Specimen Description BLOOD ARM RIGHT   Final    Special Requests     Final    Value: BOTTLES DRAWN AEROBIC AND ANAEROBIC BLUE 10CC RED 5CC   Culture  Setup Time 01/10/2012 01:42   Final    Culture     Final    Value:        BLOOD CULTURE RECEIVED NO GROWTH TO  DATE CULTURE WILL BE HELD FOR 5 DAYS BEFORE ISSUING A FINAL NEGATIVE REPORT   Report Status PENDING   Incomplete   CULTURE, BLOOD (ROUTINE X 2)     Status: Normal (Preliminary result)   Collection Time   01/09/12  7:00 PM      Component Value Range Status Comment   Specimen Description BLOOD HAND RIGHT   Final    Special Requests BOTTLES DRAWN AEROBIC ONLY 10CC   Final    Culture  Setup Time 01/10/2012 01:42   Final    Culture     Final    Value:        BLOOD CULTURE RECEIVED NO GROWTH TO DATE CULTURE WILL BE HELD FOR 5 DAYS BEFORE ISSUING A FINAL NEGATIVE REPORT   Report Status PENDING   Incomplete   MRSA PCR SCREENING     Status: Normal   Collection Time   01/09/12 11:49 PM  Component Value Range Status Comment   MRSA by PCR NEGATIVE  NEGATIVE Final   CLOSTRIDIUM DIFFICILE BY PCR     Status: Normal   Collection Time   01/11/12 10:19 AM      Component Value Range Status Comment   C difficile by pcr NEGATIVE  NEGATIVE Final      Studies: No results found.  Scheduled Meds:   . acidophilus  1 capsule Oral Daily  . amLODipine  10 mg Oral Daily  . citalopram  20 mg Oral Daily  . clopidogrel  75 mg Oral Q breakfast  . enoxaparin (LOVENOX) injection  40 mg Subcutaneous Q24H  . gabapentin  300 mg Oral BID  . ipratropium  0.5 mg Nebulization Q6H  . levalbuterol  0.63 mg Nebulization Q6H  . levofloxacin  750 mg Oral Q48H  . methylPREDNISolone (SOLU-MEDROL) injection  60 mg Intravenous Q8H  . metoprolol succinate  25 mg Oral Daily  . nicotine  14 mg Transdermal Daily  . simvastatin  10 mg Oral q1800   Continuous Infusions:   . sodium chloride 20 mL/hr at 01/10/12 2000    Principal Problem:  *Acute respiratory failure with hypoxia Active Problems:  COPD exacerbation  Tobacco abuse  H/O: CVA (cerebrovascular accident)  Hypertension  Hypercapnia  Dehydration  Diarrhea  recent Bilateral lower extremity edema  Chronic cough  RVH (right ventricular hypertrophy)   Pulmonary HTN  Chronic diastolic heart failure, NYHA class 1  Hypotension    Time spent: 35 mins    Adaliz Dobis A  [facility] Pager [phone]. If 8PM-8AM, please contact night-coverage at [email or web address], password [facility] 01/12/2012, 12:52 PM  LOS: 3 days

## 2012-01-12 NOTE — Evaluation (Signed)
Physical Therapy Evaluation Patient Details Name: Carolyn [name] MRN: [id number] DOB: [birth year] Today's Date: 01/12/2012 Time: 1012-1031 PT Time Calculation (min): 19 min  PT Assessment / Plan / Recommendation Clinical Impression  Pt admitted with acute respiratory distress after falling out of bed.  She presents with LE weakness and balance instability.  She will benefit from skilled therapy to maximize her mobility and independence for safe d/c return to AL.    PT Assessment  Patient needs continued PT services    Follow Up Recommendations  Other (comment) (will re-assess with quad cane to determine appropriate d/c)          Equipment Recommendations  None recommended by PT;Other (comment) (need to reasses if RW may be safer than quad cane)       Frequency Min 3X/week    Precautions / Restrictions Precautions Precautions: Fall Restrictions Weight Bearing Restrictions: No   Pertinent Vitals/Pain VSS, no pain.      Mobility  Bed Mobility Bed Mobility: Sit to Supine;Scooting to HOB Sit to Supine: 4: Min guard;HOB elevated Scooting to HOB: 6: Modified independent (Device/Increase time) Details for Bed Mobility Assistance: scoot to HOB in sitting  Transfers Transfers: Sit to Stand;Stand to Sit Sit to Stand: 4: Min guard;From chair/3-in-1 Stand to Sit: 4: Min guard;To bed;To chair/3-in-1 Details for Transfer Assistance: moves slowly, uses UE to stabilize, aware of fatigue and sits appropriately Ambulation/Gait Ambulation/Gait Assistance: 4: Min assist Ambulation Distance (Feet): 40 Feet Assistive device: 1 person hand held assist Ambulation/Gait Assistance Details: minor postural sway, utilized furniture few times for stability Gait Pattern: Step-through pattern;Decreased stride length;Trunk flexed Gait velocity: decr Stairs: No Wheelchair Mobility Wheelchair Mobility: No           PT Diagnosis: Difficulty walking  PT Problem List: Decreased strength;Decreased  range of motion;Decreased activity tolerance;Decreased balance;Decreased mobility;Decreased safety awareness PT Treatment Interventions: DME instruction;Gait training;Functional mobility training;Therapeutic activities;Therapeutic exercise;Balance training   PT Goals Acute Rehab PT Goals PT Goal Formulation: With patient Time For Goal Achievement: 01/26/12 Potential to Achieve Goals: Good Pt will go Sit to Supine/Side: Independently PT Goal: Sit to Supine/Side - Progress: Goal set today Pt will go Sit to Stand: with modified independence PT Goal: Sit to Stand - Progress: Goal set today Pt will go Stand to Sit: with modified independence PT Goal: Stand to Sit - Progress: Goal set today Pt will Stand: with modified independence;3 - 5 min;with unilateral upper extremity support PT Goal: Stand - Progress: Goal set today Pt will Ambulate: 51 - 150 feet;with modified independence;with least restrictive assistive device PT Goal: Ambulate - Progress: Goal set today  Visit Information  Last PT Received On: 01/12/12 Assistance Needed: +1    Subjective Data  Subjective: Everyone is so kind to me here, of course they are where i'm living too.   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 Prior Function Level of Independence: Independent with assistive device(s) Comments: Interview cut short, will need to ask additional questions next visit.  Pt lives at AL and hopes to return there.  She has had several falls in last few years but reports no major injuries.   Communication Communication: No  difficulties Dominant Hand: Right      Cognition  Overall Cognitive Status: Appears within functional limits for tasks assessed/performed Arousal/Alertness: Awake/alert Orientation Level: Appears intact for tasks assessed Behavior During Session: [facility] for tasks performed Cognition - Other Comments: pleasant, aware of situation.    Extremity/Trunk Assessment Right Upper Extremity Assessment RUE ROM/Strength/Tone: [facility] for tasks assessed Left Upper Extremity Assessment LUE ROM/Strength/Tone: WFL for tasks assessed Right Lower Extremity Assessment RLE ROM/Strength/Tone: [facility] for tasks assessed Left Lower Extremity Assessment LLE ROM/Strength/Tone: WFL for tasks assessed   Balance Balance Balance Assessed: Yes Static Sitting Balance Static Sitting - Balance Support: No upper extremity supported;Feet supported Static Sitting - Level of Assistance: 7: Independent Static Sitting - Comment/# of Minutes: sitting on commode, no difficulties Static Standing Balance Static Standing - Balance Support: Right upper extremity supported Static Standing - Level of Assistance: 4: Min assist Static Standing - Comment/# of Minutes: 1 hand held A for minor postural sway, no lob, pt likes to dance a little Dynamic Standing Balance Dynamic Standing - Balance Support: No upper extremity supported;During functional activity Dynamic Standing - Level of Assistance: 3: Mod assist Dynamic Standing - Balance Activities: Other (comment) (self care) Dynamic Standing - Comments: pt leaned against counter to wash hands, used bed for stability when standing to clean self after using 3in1  End of Session PT - End of Session Equipment Utilized During Treatment: Oxygen Activity Tolerance: Patient limited by fatigue Patient left: in bed;with call bell/phone within reach;with nursing in room Nurse Communication: Mobility status       Sharion Balloon 01/12/2012, 11:48 AM Sharion Balloon, SPT Acute Rehab Services [phone]

## 2012-01-12 NOTE — Progress Notes (Signed)
01/12/2012 1100 Nursing note MD paged and made aware of am potassium level as well as results of influenza PCR Orders received, pt. Already on droplet precautions per protocol. Will continue to closely monitor patient.  Marlyne Totaro, Blanchard Kelch

## 2012-01-12 NOTE — Evaluation (Signed)
Occupational Therapy Evaluation Patient Details Name: Carolyn [name] MRN: [id number] DOB: 1940/03/16 Today's Date: 01/12/2012 Time: 1308-6578 OT Time Calculation (min): 23 min  OT Assessment / Plan / Recommendation Clinical Impression  This 72 y.o. female with h/o COPD admitted with progressive SOB.  Pt demonstrates the below listed deficits and will benefit from OT to maximize safety and independence with BADLs to allow pt to return to supervision - modified independent level.  Pt. is very motivated and has adequate assistance, if needed, at ALF    OT Assessment  Patient needs continued OT Services    Follow Up Recommendations  No OT follow up;Supervision - Intermittent    Barriers to Discharge None    Equipment Recommendations  None recommended by OT    Recommendations for Other Services    Frequency  Min 2X/week    Precautions / Restrictions Precautions Precautions: Fall Restrictions Weight Bearing Restrictions: No    ADL  Eating/Feeding: Independent Where Assessed - Eating/Feeding: Edge of bed Grooming: Wash/dry hands;Wash/dry face;Teeth care;Min guard Where Assessed - Grooming: Supported standing Upper Body Bathing: Set up Where Assessed - Upper Body Bathing: Unsupported sitting Lower Body Bathing: Min guard Where Assessed - Lower Body Bathing: Supported sit to stand Upper Body Dressing: Set up Where Assessed - Upper Body Dressing: Unsupported sitting Lower Body Dressing: Min guard Where Assessed - Lower Body Dressing: Unsupported sit to stand Toilet Transfer: Min Pension scheme manager Method: Sit to Barista: Comfort height toilet Toileting - Architect and Hygiene: Min guard Where Assessed - Toileting Clothing Manipulation and Hygiene: Standing Transfers/Ambulation Related to ADLs: Pt ambulates with min Guard assist in room ADL Comments: Pt. with dyspnea 3/4 with activity.  Pt. requires frequent rest breaks.  sa02 86% after  walking to bathroom and back.  recovers to 92% within 2 mins.    OT Diagnosis: Generalized weakness  OT Problem List: Decreased strength;Decreased activity tolerance;Impaired balance (sitting and/or standing);Cardiopulmonary status limiting activity OT Treatment Interventions: Self-care/ADL training;Therapeutic exercise;Cognitive remediation/compensation;Patient/family education;Balance training   OT Goals Acute Rehab OT Goals OT Goal Formulation: With patient Time For Goal Achievement: 01/26/12 Potential to Achieve Goals: Good ADL Goals Pt Will Perform Grooming: with modified independence;Standing at sink ADL Goal: Grooming - Progress: Goal set today Pt Will Perform Upper Body Bathing: with modified independence;Sitting, chair;Sitting, edge of bed ADL Goal: Upper Body Bathing - Progress: Goal set today Pt Will Perform Lower Body Bathing: with modified independence;Sit to stand from chair;Sit to stand from bed ADL Goal: Lower Body Bathing - Progress: Goal set today Pt Will Perform Upper Body Dressing: with modified independence;Sitting, chair;Sitting, bed ADL Goal: Upper Body Dressing - Progress: Goal set today Pt Will Perform Lower Body Dressing: with modified independence;Sit to stand from chair;Sit to stand from bed ADL Goal: Lower Body Dressing - Progress: Goal set today Pt Will Perform Toileting - Clothing Manipulation: with modified independence;Standing ADL Goal: Toileting - Clothing Manipulation - Progress: Goal set today Pt Will Perform Toileting - Hygiene: with modified independence;Sit to stand from 3-in-1/toilet ADL Goal: Toileting - Hygiene - Progress: Goal set today Additional ADL Goal #1: Pt will be independent with theraband HEP for generalized UE strengthening ADL Goal: Additional Goal #1 - Progress: Goal set today  Visit Information  Last OT Received On: 01/12/12 Assistance Needed: +1    Subjective Data  Subjective: "I'm so ready to go home" Patient Stated Goal:  To go back to ALF   Prior Functioning     Home Living Lives  With:  (ALF) Available Help at Discharge:  (ALF) Type of Home: Assisted living Home Layout: One level Bathroom Shower/Tub: Walk-in shower Home Adaptive Equipment: Quad cane Additional Comments: pt sleeps with 3 pillows behind head at night, states she is short of breath when in supine. Prior Function Level of Independence: Independent with assistive device(s) Driving: No Vocation: Retired Comments: Interview cut short, will need to ask additional questions next visit.  Pt lives at AL and hopes to return there.  She has had several falls in last few years but reports no major injuries.   Communication Communication: No difficulties Dominant Hand: Right         Vision/Perception     Cognition  Overall Cognitive Status: Appears within functional limits for tasks assessed/performed Arousal/Alertness: Awake/alert Orientation Level: Appears intact for tasks assessed Behavior During Session: [facility] for tasks performed Cognition - Other Comments: pleasant, aware of situation.    Extremity/Trunk Assessment Right Upper Extremity Assessment RUE ROM/Strength/Tone: WFL for tasks assessed RUE Coordination: WFL - gross/fine motor Left Upper Extremity Assessment LUE ROM/Strength/Tone: Within functional levels LUE Coordination: WFL - gross/fine motor Right Lower Extremity Assessment RLE ROM/Strength/Tone: WFL for tasks assessed Left Lower Extremity Assessment LLE ROM/Strength/Tone: WFL for tasks assessed     Mobility Bed Mobility Bed Mobility: Supine to Sit;Sitting - Scoot to Edge of Bed Supine to Sit: 6: Modified independent (Device/Increase time) Sitting - Scoot to Edge of Bed: 7: Independent Sit to Supine: 4: Min guard;HOB elevated Scooting to HOB: 6: Modified independent (Device/Increase time) Details for Bed Mobility Assistance: scoot to HOB in sitting  Transfers Transfers: Sit to Stand;Stand to Sit Sit to Stand:  4: Min guard;From chair/3-in-1;With upper extremity assist Stand to Sit: 4: Min guard;To bed Details for Transfer Assistance: moves slowly, uses UE to stabilize, aware of fatigue and sits appropriately     Shoulder Instructions     Exercise     Balance Balance Balance Assessed: Yes Static Sitting Balance Static Sitting - Balance Support: No upper extremity supported;Feet supported Static Sitting - Level of Assistance: 7: Independent Static Sitting - Comment/# of Minutes: sitting EOB Static Standing Balance Static Standing - Balance Support: Right upper extremity supported Static Standing - Level of Assistance: 4: Min assist Static Standing - Comment/# of Minutes: 1 hand held A for minor postural sway, no lob, pt likes to dance a little Dynamic Standing Balance Dynamic Standing - Balance Support: No upper extremity supported;During functional activity Dynamic Standing - Level of Assistance: 3: Mod assist Dynamic Standing - Balance Activities: Other (comment) (self care) Dynamic Standing - Comments: pt leaned against counter to wash hands, used bed for stability when standing to clean self after using 3in1   End of Session OT - End of Session Activity Tolerance: Patient limited by fatigue Patient left: in bed;with call bell/phone within reach (EOB)  GO     Andrey Hoobler M 01/12/2012, 1:15 PM

## 2012-01-12 NOTE — Evaluation (Signed)
Carolyn [name],PT Acute Rehabilitation [phone] (pager)  

## 2012-01-13 ENCOUNTER — Inpatient Hospital Stay (HOSPITAL_COMMUNITY): Payer: Medicare Other

## 2012-01-13 DIAGNOSIS — R609 Edema, unspecified: Secondary | ICD-10-CM

## 2012-01-13 DIAGNOSIS — E860 Dehydration: Secondary | ICD-10-CM

## 2012-01-13 DIAGNOSIS — R197 Diarrhea, unspecified: Secondary | ICD-10-CM

## 2012-01-13 DIAGNOSIS — Z8673 Personal history of transient ischemic attack (TIA), and cerebral infarction without residual deficits: Secondary | ICD-10-CM

## 2012-01-13 LAB — CBC
MCV: 98.2 fL (ref 78.0–100.0)
Platelets: 171 10*3/uL (ref 150–400)
RBC: 3.98 MIL/uL (ref 3.87–5.11)
RDW: 13.4 % (ref 11.5–15.5)
WBC: 5.1 10*3/uL (ref 4.0–10.5)

## 2012-01-13 LAB — BASIC METABOLIC PANEL
CO2: 38 mEq/L — ABNORMAL HIGH (ref 19–32)
Chloride: 97 mEq/L (ref 96–112)
Creatinine, Ser: 0.92 mg/dL (ref 0.50–1.10)
GFR calc Af Amer: 72 mL/min — ABNORMAL LOW (ref >90)
Potassium: 3.8 mEq/L (ref 3.5–5.1)
Sodium: 138 mEq/L (ref 135–145)

## 2012-01-13 MED ORDER — NICOTINE 14 MG/24HR TD PT24: 1.0000 | MEDICATED_PATCH | Freq: Every day | TRANSDERMAL | Status: DC

## 2012-01-13 MED ORDER — PREDNISONE (PAK) 10 MG PO TABS: ORAL_TABLET | ORAL | Status: DC

## 2012-01-13 MED ORDER — IOHEXOL 350 MG/ML SOLN
80.0000 mL | Freq: Once | INTRAVENOUS | Status: AC | PRN
  Administered 2012-01-13: 80 mL via INTRAVENOUS

## 2012-01-13 MED ORDER — OSELTAMIVIR PHOSPHATE 75 MG PO CAPS
75.0000 mg | ORAL_CAPSULE | Freq: Two times a day (BID) | ORAL | Status: DC
  Administered 2012-01-13 – 2012-01-14 (×2): 75 mg via ORAL
  Filled 2012-01-13 (×3): qty 1

## 2012-01-13 MED ORDER — ALBUTEROL SULFATE (2.5 MG/3ML) 0.083% IN NEBU: 2.5000 mg | INHALATION_SOLUTION | Freq: Four times a day (QID) | RESPIRATORY_TRACT | Status: DC

## 2012-01-13 NOTE — Care Management Note (Signed)
    Page 1 of 2   01/13/2012     4:32:49 PM   CARE MANAGEMENT NOTE 01/13/2012  Patient:  Carolyn [name], Carolyn [name]   Account Number:  [id number]  Date Initiated:  01/10/2012  Documentation initiated by:  Donn Pierini  Subjective/Objective Assessment:   Pt admitted with resp. distress- Bipap     Action/Plan:   PTA pt from ALF- [facility]   Anticipated DC Date:  01/13/2012   Anticipated DC Plan:  ASSISTED LIVING / REST HOME  In-house referral  Clinical Social Worker      DC Associate Professor  CM consult      [facility] Choice  HOME HEALTH   Choice offered to / List presented to:  C-1 Patient   DME arranged  OXYGEN      DME agency  [facility]     HH arranged  HH-2 PT      [facility] agency  [facility]   Status of service:  Completed, signed off Medicare Important Message given?   (If response is "NO", the following Medicare IM given date fields will be blank) Date Medicare IM given:   Date Additional Medicare IM given:    Discharge Disposition:  HOME W HOME HEALTH SERVICES  Per UR Regulation:  Reviewed for med. necessity/level of care/duration of stay  If discussed at Long Length of Stay Meetings, dates discussed:    Comments:  01/13/12 Teng Decou,RN,BSN [phone] PT CONT TO DESAT ON RA WITH ACTIVITY.  WILL NEED HOME OXYGEN, HOME P.T.  REFERRAL TO [facility] FOR HOME O2 SET UP. FAXED REFERRAL TO [facility] FOR HHPT, PER PT'S CHOICE.  START OF CARE 24-48H POST DC DATE.

## 2012-01-13 NOTE — Progress Notes (Signed)
Have collaborated with this patient and agree with the note below. 01/13/2012  Ken Matei Magnone, PT [phone] (pager)  

## 2012-01-13 NOTE — Progress Notes (Addendum)
01/13/2012 11:27 AM Nursing note Pt. o2 saturation resting on room air 89%. Will minimal exertion on RA pt. Became very SOB and o2 saturation decreased to 87%. 3L o2 [place] applied and pt. Assisted back to bed. Pt. 02 saturation 94% on 3L o2 [place]. Nalini Alcaraz, Blanchard Kelch

## 2012-01-13 NOTE — Progress Notes (Signed)
SATURATION QUALIFICATIONS: (This note is used to comply with regulatory documentation for home oxygen)  Patient Saturations on Room Air at Rest =89%  Patient Saturations on Room Air while Ambulating = 87%  Patient Saturations on 3 Liters of oxygen while Ambulating = 94%  Please briefly explain why patient needs home oxygen:

## 2012-01-13 NOTE — Progress Notes (Addendum)
01/13/2012 2:21 PM Nursing note Pt. Ambulated on 3L o2 saturations noted to be 88% while on oxygen. Dr. Isidoro Donning paged and made aware. Verbal orders received for pt. To use 3L o2 continuous and 4L o2 while ambulating. Pt. Ambulated in room 10 ft on 4L 02 [place] and o2 saturations were at 90%.  [facility] associate updated on pt. Discharge oxygen needs as well as patient. Questions and concerns addressed.  Oland Arquette, Blanchard Kelch

## 2012-01-13 NOTE — Discharge Summary (Signed)
Physician Discharge Summary  Patient ID: FORRESTINE LECRONE MRN: [id number] DOB/AGE: 70/06/1940 71 y.o.  Admit date: 01/09/2012 Discharge date: 01/13/2012  Primary Care Physician:  Eartha Inch, MD  Discharge Diagnoses:    . Acute respiratory failure with hypoxia . COPD exacerbation . Tobacco abuse . Hypertension . Hypercapnia . Dehydration . Diarrhea . Chronic cough . RVH (right ventricular hypertrophy) . Pulmonary HTN . Chronic diastolic heart failure, NYHA class 1 . Hypotension  Consults:  None   Discharge Medications:   Medication List     As of 01/13/2012 11:25 AM    STOP taking these medications         losartan 50 MG tablet   Commonly known as: COZAAR      TAKE these medications         acetaminophen 325 MG tablet   Commonly known as: TYLENOL   Take 650 mg by mouth 3 (three) times daily as needed. For pain      ACIDOPHILUS PO   Take 1 tablet by mouth daily.      albuterol 108 (90 BASE) MCG/ACT inhaler   Commonly known as: PROVENTIL HFA;VENTOLIN HFA   Inhale 2 puffs into the lungs every 4 (four) hours as needed. For shortness of breath      albuterol (2.5 MG/3ML) 0.083% nebulizer solution   Commonly known as: PROVENTIL   Take 3 mLs (2.5 mg total) by nebulization 4 (four) times daily. For wheezing      amLODipine 10 MG tablet   Commonly known as: NORVASC   Take 10 mg by mouth daily.      budesonide-formoterol 160-4.5 MCG/ACT inhaler   Commonly known as: SYMBICORT   Inhale 2 puffs into the lungs 2 (two) times daily.      citalopram 20 MG tablet   Commonly known as: CELEXA   Take 20 mg by mouth daily.      clonazePAM 0.5 MG tablet   Commonly known as: KLONOPIN   Take 0.25 mg by mouth every 6 (six) hours as needed. For anxiety      clopidogrel 75 MG tablet   Commonly known as: PLAVIX   Take 75 mg by mouth daily.      gabapentin 300 MG capsule   Commonly known as: NEURONTIN   Take 300 mg by mouth 2 (two) times daily.      loperamide 2 MG  tablet   Commonly known as: IMODIUM A-D   Take 2 mg by mouth 4 (four) times daily as needed. For diarrhea      magnesium oxide 400 MG tablet   Commonly known as: MAG-OX   Take 400 mg by mouth 2 (two) times daily.      metoprolol succinate 25 MG 24 hr tablet   Commonly known as: TOPROL-XL   Take 25 mg by mouth daily.      nicotine 14 mg/24hr patch   Commonly known as: NICODERM CQ - dosed in mg/24 hours   Place 1 patch onto the skin daily.      ondansetron 4 MG tablet   Commonly known as: ZOFRAN   Take 4 mg by mouth every 6 (six) hours as needed. For nausea      pravastatin 20 MG tablet   Commonly known as: PRAVACHOL   Take 20 mg by mouth every evening.      predniSONE 10 MG tablet   Commonly known as: STERAPRED UNI-PAK   Prednisone dosing: Take  Prednisone 40mg  (4 tabs) x 3 days, then  taper to 30mg  (3 tabs) x 3 days, then 20mg  (2 tabs) x 3days, then 10mg  (1 tab) x 3days, then OFF.    Dispense:  30 tabs, refills: None      promethazine 25 MG tablet   Commonly known as: PHENERGAN   Take 12.5 mg by mouth every 6 (six) hours as needed. For nausea         Brief H and P: For complete details please refer to admission H and P, but in brief, This is a 72 year old female with history of COPD, not on home oxygen. She had a CVA 2 years ago and at that point she discontinued smoking. She does, however, continue to have chronic shortness of breath. In November the patient started smoking again. Her shortness of breath got worse as did her exercise stamina. Last night she developed an acute nonproductive cough and acutely worsening shortness of breath and wheeze. She felt weak. She was using her nebulizer. She fell off her bed and was unable to get up. She was found 20 minutes later. At that point she was in respiratory distress wheezing, very tachycardic. 911 was called and she was brought to the ER. In the ER the patient was satting 91% on oxygen, respiratory rate of 35. ABG was done she  was hypercarbic, she was placed on BiPAP. The patient didreport some nausea, vomiting, myalgia, feeling cold but no fevers.   Hospital Course:   Acute respiratory failure with hypoxia and Hypercapnia (respiratory acidosis) / influenza A and H1N1 positive : RESOLVED, appeared to be primarily related to acute COPD exacerbation, she has been successfully weaned from BiPAP to nasal cannula oxygen. Likely triggered by influenza. As patient was feeling better, she was placed on supportive care. She was out of therapeutic window for Tamiflu.  COPD exacerbation / Tobacco abuse: Seems to be primary etiology of patient's symptoms and possible underlying bronchitis. Patient was placed on IV steroids, breathing treatments, she has been transitioned to oral prednisone   Dehydration/Diarrhea: C diff negative  Acute renal failure: Baseline creatinine 2012 0.7 and in February 2013 was 1.13 which is reflective of stage III chronic kidney disease. At the time of admission, creatinine elevated at 1.57 with high normal BUN, resolved with IV hydration.   Hypotension: Felt to be primarily related to volume depletion - is now normotensive and eating  Diet.   Hyperkalemia: resolved, patient was given Kayexalate with insulin and D50.   Recent bilateral lower extremity edema,Chronic diastolic heart failure, NYHA class 1: compensated  Was empirically started on Lasix which has been discontinued. Suspect may have been precipitated by right-sided heart failure.    H/O: CVA (cerebrovascular accident)  *Continue Plavix   Generalized weakness  Appreciate PT evaluation, recommended home PT/OT, home O2 eval, RW  Day of Discharge BP 130/59  Pulse 94  Temp 98 F (36.7 C) (Oral)  Resp 18  Ht 5\' 1"  (1.549 m)  Wt 77.565 kg (171 lb)  BMI 32.31 kg/m2  SpO2 96%  Physical Exam: General: Alert and awake oriented x3 not in any acute distress. HEENT: anicteric sclera, pupils reactive to light and accommodation CVS: S1-S2  clear no murmur rubs or gallops Chest: clear to auscultation bilaterally, no wheezing rales or rhonchi Abdomen: soft nontender, nondistended, normal bowel sounds, no organomegaly Extremities: no cyanosis, clubbing. trace edema noted bilaterally Neuro: Cranial nerves II-XII intact, no focal neurological deficits   The results of significant diagnostics from this hospitalization (including imaging, microbiology, ancillary and laboratory) are listed below  for reference.    LAB RESULTS: Basic Metabolic Panel:  Lab 01/13/12 4782 01/12/12 1130  NA 138 138  K 3.8 5.7*  CL 97 101  CO2 38* 29  GLUCOSE 111* 136*  BUN 26* 26*  CREATININE 0.92 0.80  CALCIUM 9.8 9.9  MG -- --  PHOS -- --   CBC:  Lab 01/13/12 0535 01/10/12 0508 01/09/12 1842  WBC 5.1 6.5 --  NEUTROABS -- -- 8.2*  HGB 12.6 12.0 --  HCT 39.1 36.9 --  MCV 98.2 -- --  PLT 171 189 --   Cardiac Enzymes:  Lab 01/09/12 1846  CKTOTAL --  CKMB --  CKMBINDEX --  TROPONINI <0.30   BNP: No components found with this basename: POCBNP:2 CBG: No results found for this basename: GLUCAP:2 in the last 168 hours  Significant Diagnostic Studies:  Dg Chest Portable 1 View  01/09/2012  *RADIOLOGY REPORT*  Clinical Data: 72 year old female nausea vomiting shortness of breath.  PORTABLE CHEST - 1 VIEW  Comparison: 01/06/2012 and earlier.  Findings: Portable semi upright AP view 2037 hours.  Overall lower lung volumes.  Stable cardiac size and mediastinal contours.  No pneumothorax, pulmonary edema, pleural effusion or acute pulmonary opacity.  IMPRESSION: Lower lung volumes.  No acute cardiopulmonary abnormality.   Original Report Authenticated By: Erskine Speed, M.D.      Disposition and Follow-up:     Discharge Orders    Future Orders Please Complete By Expires   Diet - low sodium heart healthy      Increase activity slowly          DISPOSITION: ALF  DIET: heart healthy  ACTIVITY: as tolerated   DISCHARGE  FOLLOW-UP Follow-up Information    Follow up with Eartha Inch, MD. Schedule an appointment as soon as possible for a visit in 10 days. (for hospital follow-up)    Contact information:   [address] [place] [address] [phone]          Time spent on Discharge: 40 mins  Signed:   RAI,RIPUDEEP M.D. [facility] 01/13/2012, 11:25 AM Pager: [phone]

## 2012-01-13 NOTE — Clinical Social Work Note (Signed)
CSW notified [facility] of hold on Pt's dc. They are okay with taking Pt back over weekend per Jeannette Corpus, Charity fundraiser.  Weekend CSW will f/u to facilitate dc.   Frederico Hamman, LCSW [phone]

## 2012-01-13 NOTE — Plan of Care (Signed)
Called by RN regarding the persistent hypoxia and desaturation. Patient has been on 4L O2 vial [place] during hospitalization. Home O2 evaluation was done and on ambulating patient desats to 87-88% on 3L. - I will rule out pulmonary embolism due to persistent hypoxia. If negative, cont current management with Nebs, steroids. Adding tamiflu at this point may not have much benefit, but due to steroids, it might delay her improvement in the pulmonary status with ongoing influenza, so considering risks vs benefits, will start tamiflu. Patient was not on any home O2 prior to admission. Will also eval from CT chest if there is any secondary PNA due to influenza. - Hold discharge for now until CT imaging results and further plan of care.   RAI,RIPUDEEP M.D. [facility] 01/13/2012, 3:58 PM  Pager: [phone]

## 2012-01-13 NOTE — Clinical Social Work Psychosocial (Signed)
Clinical Social Work Department BRIEF PSYCHOSOCIAL ASSESSMENT 01/13/2012  Patient:  Carolyn [name], Carolyn [name]     Account Number:  [id number]     Admit date:  01/09/2012  Clinical Social Worker:  Thomasene Mohair  Date/Time:  01/13/2012 11:00 AM  Referred by:  Physician  Date Referred:  01/12/2012 Referred for  ALF Placement   Other Referral:   return to alf   Interview type:  Patient Other interview type:    PSYCHOSOCIAL DATA Living Status:  FACILITY Admitted from facility:  [facility] Level of care:  Assisted Living Primary support name:  Kilby,Dawn Primary support relationship to patient:  CHILD, ADULT Degree of support available:   Ex-husband also supportive    CURRENT CONCERNS Current Concerns  Post-Acute Placement   Other Concerns:    SOCIAL WORK ASSESSMENT / PLAN CSW was referred to Pt to assist with dc coordination back to ALF [facility].  Pt is fairly independent, with need for supervision at times and assistance with medication. Pt has good support. Plan to return at dc.   Assessment/plan status:  Psychosocial Support/Ongoing Assessment of Needs Other assessment/ plan:   update FL2   Information/referral to community resources:    PATIENT'S/FAMILY'S RESPONSE TO PLAN OF CARE: Pt is very pleased with her care at [facility]. She made a point to tell CSW that she appreciates staff support. Pt will need amublance ride back to alf because her exhusband is not comfortable with taking care of pt and her oxygen tank in transit.   Frederico Hamman, LCSW [phone]

## 2012-01-13 NOTE — Progress Notes (Signed)
Physical Therapy Treatment Patient Details Name: Carolyn [name] MRN: [id number] DOB: 30-Dec-1940 Today's Date: 01/13/2012 Time: 4010-2725 PT Time Calculation (min): 29 min  PT Assessment / Plan / Recommendation Comments on Treatment Session  Pt admitted with respiratory failure after recent fall and COPD exacerbation.  Presents with LE weakness, poor activity tolerance, and mobility limitations.  Will continue to benefit from skilled therapy.  Pt states there is physical therapy at [facility] and agreed she would benefit from PT to incr strength, activity tolerance, and safety.     Follow Up Recommendations  Home health PT;Supervision - Intermittent           Equipment Recommendations  None recommended by PT       Frequency Min 3X/week   Plan Discharge plan remains appropriate;Frequency remains appropriate    Precautions / Restrictions Precautions Precautions: Fall Restrictions Weight Bearing Restrictions: No   Pertinent Vitals/Pain O2 sats:  On 3L via [place]: <93% at rest, 89-90 with activity On RA: 90% at rest, 88% with activity Denies pain, dizziness   Mobility  Bed Mobility Bed Mobility: Sit to Supine;Scooting to HOB Sit to Supine: 5: Supervision;HOB elevated Scooting to HOB: 6: Modified independent (Device/Increase time) Details for Bed Mobility Assistance: pt moves slowly but appropriately uses rails and UE to assist in bed mobility Transfers Transfers: Sit to Stand;Stand to Sit Sit to Stand: 5: Supervision;From chair/3-in-1;With upper extremity assist;From bed Stand to Sit: 5: Supervision;With upper extremity assist;To bed Details for Transfer Assistance: pt cautious, uses UE for support, no lob Ambulation/Gait Ambulation/Gait Assistance: 4: Min assist Ambulation Distance (Feet): 40 Feet Assistive device: 1 person hand held assist Ambulation/Gait Assistance Details: good posture, minor sway, uses hand held A and/or furniture to steady self Gait Pattern: Step-through  pattern;Decreased stride length;Trunk flexed Gait velocity: decr Stairs: No Wheelchair Mobility Wheelchair Mobility: No    Exercises General Exercises - Lower Extremity Long Arc Quad: AROM;Both;10 reps;Seated Hip Flexion/Marching: AROM;Both;10 reps;Seated     PT Goals Acute Rehab PT Goals PT Goal: Sit to Supine/Side - Progress: Progressing toward goal PT Goal: Sit to Stand - Progress: Progressing toward goal PT Goal: Stand to Sit - Progress: Progressing toward goal PT Goal: Stand - Progress: Progressing toward goal PT Goal: Ambulate - Progress: Progressing toward goal  Visit Information  Last PT Received On: 01/13/12 Assistance Needed: +1    Subjective Data  Subjective: I've been doing these exercises this morning. (refering to arm exercises) Patient Stated Goal: to go home Pt also states that she feels well but can tell she is weaker than normal.  Additional Home Living information: pt wears pendant for additional safety; uses quad cane for ambulation but also has RW and will have WC to A in traveling to meals.    Cognition  Overall Cognitive Status: Appears within functional limits for tasks assessed/performed Arousal/Alertness: Awake/alert Orientation Level: Appears intact for tasks assessed Behavior During Session: [facility] for tasks performed    Balance  Balance Balance Assessed: Yes Static Sitting Balance Static Sitting - Balance Support: No upper extremity supported;Feet supported Static Sitting - Level of Assistance: 7: Independent Static Standing Balance Static Standing - Balance Support: Right upper extremity supported Static Standing - Level of Assistance: 4: Min assist Static Standing - Comment/# of Minutes: standing, no lob, fatigues quickly with incr wob Single Leg Stance - Right Leg:  (10s with 1 hand held A) Single Leg Stance - Left Leg:  (<10s with bilat hand held A)  End of Session PT -  End of Session Equipment Utilized During Treatment:  Oxygen Activity Tolerance: Patient limited by fatigue Patient left: in bed;with call bell/phone within reach Nurse Communication: Mobility status;Other (comment) (O2 sats on RA and on 3L O2)       Sharion Balloon 01/13/2012, 1:39 PM Sharion Balloon, SPT Acute Rehab Services [phone]

## 2012-01-14 DIAGNOSIS — R05 Cough: Secondary | ICD-10-CM

## 2012-01-14 MED ORDER — ALBUTEROL SULFATE (2.5 MG/3ML) 0.083% IN NEBU: 2.5000 mg | INHALATION_SOLUTION | Freq: Three times a day (TID) | RESPIRATORY_TRACT | Status: DC

## 2012-01-14 MED ORDER — PREDNISONE (PAK) 10 MG PO TABS: ORAL_TABLET | ORAL | Status: DC

## 2012-01-14 MED ORDER — OSELTAMIVIR PHOSPHATE 75 MG PO CAPS: 75.0000 mg | ORAL_CAPSULE | Freq: Two times a day (BID) | ORAL | Status: DC

## 2012-01-14 MED ORDER — NICOTINE 14 MG/24HR TD PT24: 1.0000 | MEDICATED_PATCH | Freq: Every day | TRANSDERMAL | Status: DC

## 2012-01-14 NOTE — Progress Notes (Signed)
Patient ID: Carolyn [name]  female  WUJ:[id number]    DOB: Jan 29, 1940    DOA: 01/09/2012  PCP: Eartha Inch, MD  Assessment/Plan: Principal Problem: Acute respiratory failure with hypoxia and Hypercapnia (respiratory acidosis) / influenza A and H1N1 positive : RESOLVED, appeared to be primarily related to acute COPD exacerbation, she has been successfully weaned from BiPAP to nasal cannula oxygen. Likely triggered by influenza. Given patient was having persistent hypoxia, discharge was held yesterday and CTA chest was obtained, which did not show any PE or PNA. Adding tamiflu at this point may not have much benefit, but due to steroids, it might delay her improvement in the pulmonary status with ongoing influenza, so considering risks vs benefits, tamiflu was started.  Patient was not on any home O2 prior to admission. Outpatient pulmonology referral was sent.  COPD exacerbation / Tobacco abuse: Seems to be primary etiology of patient's symptoms and possible underlying bronchitis. Patient was placed on IV steroids, breathing treatments, she has been transitioned to oral prednisone.   Dehydration/Diarrhea: C diff negative   Acute renal failure: Baseline creatinine 2012 0.7 and in February 2013 was 1.13 which is reflective of stage III chronic kidney disease. At the time of admission, creatinine elevated at 1.57 with high normal BUN, resolved with IV hydration.   Hypotension: Felt to be primarily related to volume depletion - is now normotensive and eating Diet.   Hyperkalemia: resolved, patient was given Kayexalate with insulin and D50.   Recent bilateral lower extremity edema,Chronic diastolic heart failure, NYHA class 1: compensated  Was empirically started on Lasix which has been discontinued. Suspect may have been precipitated by right-sided heart failure.   H/O: CVA (cerebrovascular accident)  *Continue Plavix   Generalized weakness  Appreciate PT evaluation, recommended home PT/OT,  home O2 eval, RW  DVT Prophylaxis: lovenox  Code Status: FC  Disposition: DC to ALF today    Subjective: Wants to be discharged today  Objective: Weight change:   Intake/Output Summary (Last 24 hours) at 01/14/12 1142 Last data filed at 01/14/12 0900  Gross per 24 hour  Intake    240 ml  Output      0 ml  Net    240 ml   Blood pressure 124/77, pulse 86, temperature 97.9 F (36.6 C), temperature source Oral, resp. rate 18, height 5\' 1"  (1.549 m), weight 77.565 kg (171 lb), SpO2 90.00%.  Physical Exam: General: Alert and awake, oriented x3, not in any acute distress. HEENT: anicteric sclera, pupils reactive to light and accommodation, EOMI CVS: S1-S2 clear, no murmur rubs or gallops Chest: clear to auscultation bilaterally, no wheezing, rales or rhonchi Abdomen: soft nontender, nondistended, normal bowel sounds, no organomegaly Extremities: no cyanosis, clubbing or edema noted bilaterally Neuro: Cranial nerves II-XII intact, no focal neurological deficits  Lab Results: Basic Metabolic Panel:  Lab 01/13/12 9562 01/12/12 1130  NA 138 138  K 3.8 5.7*  CL 97 101  CO2 38* 29  GLUCOSE 111* 136*  BUN 26* 26*  CREATININE 0.92 0.80  CALCIUM 9.8 9.9  MG -- --  PHOS -- --   CBC:  Lab 01/13/12 0535 01/10/12 0508 01/09/12 1842  WBC 5.1 6.5 --  NEUTROABS -- -- 8.2*  HGB 12.6 12.0 --  HCT 39.1 36.9 --  MCV 98.2 98.9 --  PLT 171 189 --   Cardiac Enzymes:  Lab 01/09/12 1846  CKTOTAL --  CKMB --  CKMBINDEX --  TROPONINI <0.30   BNP: No components found with this  basename: POCBNP:2 CBG: No results found for this basename: GLUCAP:5 in the last 168 hours   Micro Results: Recent Results (from the past 240 hour(s))  CULTURE, BLOOD (ROUTINE X 2)     Status: Normal (Preliminary result)   Collection Time   01/09/12  6:47 PM      Component Value Range Status Comment   Specimen Description BLOOD ARM RIGHT   Final    Special Requests     Final    Value: BOTTLES  DRAWN AEROBIC AND ANAEROBIC BLUE 10CC RED 5CC   Culture  Setup Time 01/10/2012 01:42   Final    Culture     Final    Value:        BLOOD CULTURE RECEIVED NO GROWTH TO DATE CULTURE WILL BE HELD FOR 5 DAYS BEFORE ISSUING A FINAL NEGATIVE REPORT   Report Status PENDING   Incomplete   CULTURE, BLOOD (ROUTINE X 2)     Status: Normal (Preliminary result)   Collection Time   01/09/12  7:00 PM      Component Value Range Status Comment   Specimen Description BLOOD HAND RIGHT   Final    Special Requests BOTTLES DRAWN AEROBIC ONLY 10CC   Final    Culture  Setup Time 01/10/2012 01:42   Final    Culture     Final    Value:        BLOOD CULTURE RECEIVED NO GROWTH TO DATE CULTURE WILL BE HELD FOR 5 DAYS BEFORE ISSUING A FINAL NEGATIVE REPORT   Report Status PENDING   Incomplete   MRSA PCR SCREENING     Status: Normal   Collection Time   01/09/12 11:49 PM      Component Value Range Status Comment   MRSA by PCR NEGATIVE  NEGATIVE Final   CLOSTRIDIUM DIFFICILE BY PCR     Status: Normal   Collection Time   01/11/12 10:19 AM      Component Value Range Status Comment   C difficile by pcr NEGATIVE  NEGATIVE Final     Studies/Results: Ct Angio Chest Pe W/cm &/or Wo Cm  01/13/2012  *RADIOLOGY REPORT*  Clinical Data: Severe shortness of breath.  Current history of COPD.  CT ANGIOGRAPHY CHEST  Technique:  Multidetector CT imaging of the chest using the standard protocol during bolus administration of intravenous contrast. Multiplanar reconstructed images including MIPs were obtained and reviewed to evaluate the vascular anatomy.  Contrast: 80mL OMNIPAQUE IOHEXOL 350 MG/ML SOLN  Comparison: None.  Findings: Contrast opacification of the pulmonary arteries is excellent.  Patient motion blurred images throughout the examination.  Overall, the study is of moderate to good diagnostic quality.  No filling defects within either main pulmonary artery or their branches in either lung to suggest pulmonary embolism.  Heart  enlarged with left ventricular hypertrophy.  No pericardial effusion.  Severe three-vessel coronary atherosclerosis.  Moderate to severe atherosclerosis involving the thoracic and upper abdominal aorta and their visualized branches without aneurysm or dissection.  Emphysematous changes throughout both lungs.  Central airways patent with moderate to marked bronchial wall thickening. Atelectasis and/or scarring in the right middle lobe and right lower lobe.  No localized airspace consolidation.  No pulmonary parenchymal nodules or masses.  No pleural effusions.  Scattered normal-sized mediastinal and hilar lymph nodes; no significant lymphadenopathy.  Visualized thyroid gland unremarkable.  Small hiatal hernia.  Visualized upper abdomen otherwise unremarkable for the early arterial phase enhancement. 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  IMPRESSION:  1.  No evidence of pulmonary embolism. 2.  COPD/emphysema.  Linear atelectasis versus scarring in the right middle and lower lobes.  No acute cardiopulmonary disease otherwise. 3.  Cardiomegaly.  Left ventricular hypertrophy.  Three-vessel coronary atherosclerosis. 4.  Small hiatal hernia.   Original Report Authenticated By: Hulan Saas, M.D.    Dg Chest Portable 1 View  01/09/2012  *RADIOLOGY REPORT*  Clinical Data: 72 year old female nausea vomiting shortness of breath.  PORTABLE CHEST - 1 VIEW  Comparison: 01/06/2012 and earlier.  Findings: Portable semi upright AP view 2037 hours.  Overall lower lung volumes.  Stable cardiac size and mediastinal contours.  No pneumothorax, pulmonary edema, pleural effusion or acute pulmonary opacity.  IMPRESSION: Lower lung volumes.  No acute cardiopulmonary abnormality.   Original Report Authenticated By: Erskine Speed, M.D.     Medications: Scheduled Meds:   . acidophilus  1 capsule Oral Daily  . amLODipine  10 mg Oral Daily  . citalopram  20 mg Oral Daily  . clopidogrel  75 mg Oral Q  breakfast  . enoxaparin (LOVENOX) injection  40 mg Subcutaneous Q24H  . gabapentin  300 mg Oral BID  . ipratropium  0.5 mg Nebulization Q6H  . levalbuterol  0.63 mg Nebulization Q6H  . metoprolol succinate  25 mg Oral Daily  . nicotine  14 mg Transdermal Daily  . oseltamivir  75 mg Oral BID  . predniSONE  40 mg Oral Q breakfast  . simvastatin  10 mg Oral q1800      LOS: 5 days   Bryten Maher M.D. [facility] 01/14/2012, 11:42 AM Pager: [phone]  If 7PM-7AM, please contact night-coverage [email or web address] Password [email or web address]

## 2012-01-14 NOTE — Discharge Summary (Signed)
Physician Discharge Summary  Patient ID: Carolyn [name] MRN: [id number] DOB/AGE: November 03, 1940 72 y.o.  Admit date: 01/09/2012 Discharge date: 01/14/2012  Primary Care Physician:  Eartha Inch, MD  Discharge Diagnoses:    . Acute respiratory failure with hypoxia . COPD exacerbation . Tobacco abuse . Hypertension . Hypercapnia . Dehydration . Diarrhea- C diff negative . Chronic cough . RVH (right ventricular hypertrophy) . Pulmonary HTN . Chronic diastolic heart failure, NYHA class 1 . Hypotension Influenza A/ H1N1 positive  Consults:  None   Discharge Medications:   Medication List     As of 01/14/2012 11:47 AM    STOP taking these medications         losartan 50 MG tablet   Commonly known as: COZAAR      TAKE these medications         acetaminophen 325 MG tablet   Commonly known as: TYLENOL   Take 650 mg by mouth 3 (three) times daily as needed. For pain      ACIDOPHILUS PO   Take 1 tablet by mouth daily.      albuterol 108 (90 BASE) MCG/ACT inhaler   Commonly known as: PROVENTIL HFA;VENTOLIN HFA   Inhale 2 puffs into the lungs every 4 (four) hours as needed. For shortness of breath      albuterol (2.5 MG/3ML) 0.083% nebulizer solution   Commonly known as: PROVENTIL   Take 3 mLs (2.5 mg total) by nebulization 3 (three) times daily. For wheezing      amLODipine 10 MG tablet   Commonly known as: NORVASC   Take 10 mg by mouth daily.      budesonide-formoterol 160-4.5 MCG/ACT inhaler   Commonly known as: SYMBICORT   Inhale 2 puffs into the lungs 2 (two) times daily.      citalopram 20 MG tablet   Commonly known as: CELEXA   Take 20 mg by mouth daily.      clonazePAM 0.5 MG tablet   Commonly known as: KLONOPIN   Take 0.25 mg by mouth every 6 (six) hours as needed. For anxiety      clopidogrel 75 MG tablet   Commonly known as: PLAVIX   Take 75 mg by mouth daily.      gabapentin 300 MG capsule   Commonly known as: NEURONTIN   Take 300 mg by mouth 2  (two) times daily.      loperamide 2 MG tablet   Commonly known as: IMODIUM A-D   Take 2 mg by mouth 4 (four) times daily as needed. For diarrhea      magnesium oxide 400 MG tablet   Commonly known as: MAG-OX   Take 400 mg by mouth 2 (two) times daily.      metoprolol succinate 25 MG 24 hr tablet   Commonly known as: TOPROL-XL   Take 25 mg by mouth daily.      nicotine 14 mg/24hr patch   Commonly known as: NICODERM CQ - dosed in mg/24 hours   Place 1 patch onto the skin daily.      ondansetron 4 MG tablet   Commonly known as: ZOFRAN   Take 4 mg by mouth every 6 (six) hours as needed. For nausea      oseltamivir 75 MG capsule   Commonly known as: TAMIFLU   Take 1 capsule (75 mg total) by mouth 2 (two) times daily. X 4 more days      pravastatin 20 MG tablet   Commonly known as:  PRAVACHOL   Take 20 mg by mouth every evening.      predniSONE 10 MG tablet   Commonly known as: STERAPRED UNI-PAK   Prednisone dosing: Take  Prednisone 40mg  (4 tabs) x 3 days, then taper to 30mg  (3 tabs) x 3 days, then 20mg  (2 tabs) x 3days, then 10mg  (1 tab) x 3days, then OFF.    Dispense:  30 tabs, refills: None      promethazine 25 MG tablet   Commonly known as: PHENERGAN   Take 12.5 mg by mouth every 6 (six) hours as needed. For nausea         Brief H and P: For complete details please refer to admission H and P, but in briefFor complete details please refer to admission H and P, but in brief, This is a 72 year old female with history of COPD, not on home oxygen. She had a CVA 2 years ago and at that point she discontinued smoking. She does, however, continue to have chronic shortness of breath. In November the patient started smoking again. Her shortness of breath got worse as did her exercise stamina. Last night she developed an acute nonproductive cough and acutely worsening shortness of breath and wheeze. She felt weak. She was using her nebulizer. She fell off her bed and was unable to  get up. She was found 20 minutes later. At that point she was in respiratory distress wheezing, very tachycardic. 911 was called and she was brought to the ER. In the ER the patient was satting 91% on oxygen, respiratory rate of 35. ABG was done she was hypercarbic, she was placed on BiPAP. The patient didreport some nausea, vomiting, myalgia, feeling cold but no fevers   Hospital Course:  Acute respiratory failure with hypoxia and Hypercapnia (respiratory acidosis) / influenza A and H1N1 positive : RESOLVED, appeared to be primarily related to acute COPD exacerbation, she has been successfully weaned from BiPAP to nasal cannula oxygen. Likely triggered by influenza. Given patient was having persistent hypoxia, discharge was held yesterday and CTA chest was obtained, which did not show any PE or PNA. Adding tamiflu at this point may not have much benefit, but due to steroids, it might delay her improvement in the pulmonary status with ongoing influenza, so considering risks vs benefits, tamiflu was started. Patient was not on any home O2 prior to admission. Outpatient pulmonology referral was sent.  COPD exacerbation / Tobacco abuse: Seems to be primary etiology of patient's symptoms and possible underlying bronchitis. Patient was placed on IV steroids, breathing treatments, she has been transitioned to oral prednisone.  Dehydration/Diarrhea: C diff negative  Acute renal failure: Baseline creatinine 2012 0.7 and in February 2013 was 1.13 which is reflective of stage III chronic kidney disease. At the time of admission, creatinine elevated at 1.57 with high normal BUN, resolved with IV hydration.  Hypotension: Felt to be primarily related to volume depletion - is now normotensive and eating Diet.  Hyperkalemia: resolved, patient was given Kayexalate with insulin and D50.  Recent bilateral lower extremity edema,Chronic diastolic heart failure, NYHA class 1: compensated  Was empirically started on Lasix  which has been discontinued. Suspect may have been precipitated by right-sided heart failure.  H/O: CVA (cerebrovascular accident)  *Continue Plavix  Generalized weakness  Appreciate PT evaluation, recommended home PT/OT, home O2 eval, RW    Day of Discharge BP 124/77  Pulse 86  Temp 97.9 F (36.6 C) (Oral)  Resp 18  Ht 5\' 1"  (1.549 m)  Wt 77.565 kg (171 lb)  BMI 32.31 kg/m2  SpO2 90%  Physical Exam: General: Alert and awake oriented x3 not in any acute distress. HEENT: anicteric sclera, pupils reactive to light and accommodation CVS: S1-S2 clear no murmur rubs or gallops Chest: clear to auscultation bilaterally, no wheezing rales or rhonchi Abdomen: soft nontender, nondistended, normal bowel sounds, no organomegaly Extremities: no cyanosis, clubbing or edema noted bilaterally Neuro: Cranial nerves II-XII intact, no focal neurological deficits   The results of significant diagnostics from this hospitalization (including imaging, microbiology, ancillary and laboratory) are listed below for reference.    LAB RESULTS: Basic Metabolic Panel:  Lab 01/13/12 1610 01/12/12 1130  NA 138 138  K 3.8 5.7*  CL 97 101  CO2 38* 29  GLUCOSE 111* 136*  BUN 26* 26*  CREATININE 0.92 0.80  CALCIUM 9.8 9.9  MG -- --  PHOS -- --   CBC:  Lab 01/13/12 0535 01/10/12 0508 01/09/12 1842  WBC 5.1 6.5 --  NEUTROABS -- -- 8.2*  HGB 12.6 12.0 --  HCT 39.1 36.9 --  MCV 98.2 -- --  PLT 171 189 --   Cardiac Enzymes:  Lab 01/09/12 1846  CKTOTAL --  CKMB --  CKMBINDEX --  TROPONINI <0.30   BNP: No components found with this basename: POCBNP:2 CBG: No results found for this basename: GLUCAP:2 in the last 168 hours  Significant Diagnostic Studies:  Dg Chest Portable 1 View  01/09/2012  *RADIOLOGY REPORT*  Clinical Data: 72 year old female nausea vomiting shortness of breath.  PORTABLE CHEST - 1 VIEW  Comparison: 01/06/2012 and earlier.  Findings: Portable semi upright AP view 2037  hours.  Overall lower lung volumes.  Stable cardiac size and mediastinal contours.  No pneumothorax, pulmonary edema, pleural effusion or acute pulmonary opacity.  IMPRESSION: Lower lung volumes.  No acute cardiopulmonary abnormality.   Original Report Authenticated By: Erskine Speed, M.D.      Disposition and Follow-up:     Discharge Orders    Future Orders Please Complete By Expires   Ambulatory referral to Pulmonology      Comments:   72year old female with respiratory failure, severe COPD/emphysema, on O2, influenza. Please see patient for hospital follow-up and care.   Diet - low sodium heart healthy      Increase activity slowly      Discharge instructions      Comments:   Please continue Albuterol Nebs three times/daily for a week, then continue twice/day after that. Pulmonology referral has been sent to [facility].       DISPOSITION: Assisted-living facility DIET: Heart healthy diet ACTIVITY: As tolerated   DISCHARGE FOLLOW-UP Follow-up Information    Follow up with Eartha Inch, MD. Schedule an appointment as soon as possible for a visit in 10 days. (for hospital follow-up)    Contact information:   [address] [place] [address] [phone]          Time spent on Discharge: 20 minutes  Signed:   RAI,RIPUDEEP M.D. [facility] 01/14/2012, 11:47 AM Pager: [phone]

## 2012-01-14 NOTE — Clinical Social Work Note (Signed)
CSW confirmed with [facility] pt able to return today. CSW completed ambulance form and RN to call for transport when ready. CSW signing off at d/c.  Dellie Burns, MSW, LCSWA [phone] (Weekends 8:00am-4:30pm)

## 2012-01-16 LAB — CULTURE, BLOOD (ROUTINE X 2): Culture: NO GROWTH

## 2012-02-23 ENCOUNTER — Institutional Professional Consult (permissible substitution): Payer: Medicare Other | Admitting: Critical Care Medicine

## 2012-03-05 ENCOUNTER — Encounter: Payer: Self-pay | Admitting: Critical Care Medicine

## 2012-03-05 ENCOUNTER — Ambulatory Visit (INDEPENDENT_AMBULATORY_CARE_PROVIDER_SITE_OTHER): Payer: Medicare Other | Admitting: Critical Care Medicine

## 2012-03-05 VITALS — BP 120/60 | HR 64 | Ht 61.5 in | Wt 152.5 lb

## 2012-03-05 DIAGNOSIS — J9610 Chronic respiratory failure, unspecified whether with hypoxia or hypercapnia: Secondary | ICD-10-CM

## 2012-03-05 DIAGNOSIS — F17200 Nicotine dependence, unspecified, uncomplicated: Secondary | ICD-10-CM

## 2012-03-05 DIAGNOSIS — I2789 Other specified pulmonary heart diseases: Secondary | ICD-10-CM

## 2012-03-05 DIAGNOSIS — J438 Other emphysema: Secondary | ICD-10-CM

## 2012-03-05 DIAGNOSIS — J4489 Other specified chronic obstructive pulmonary disease: Secondary | ICD-10-CM

## 2012-03-05 MED ORDER — ACLIDINIUM BROMIDE 400 MCG/ACT IN AEPB: 1.0000 | INHALATION_SPRAY | Freq: Two times a day (BID) | RESPIRATORY_TRACT | Status: DC

## 2012-03-05 NOTE — Assessment & Plan Note (Signed)
This pt is currently OFF cigarettes 03/05/2012

## 2012-03-05 NOTE — Assessment & Plan Note (Signed)
Gold D Copd with frequent exacerbations and hospital stays FeV1 29% post BD Plan Stay on symbicort Start Tudorza one puff twice daily Use oxygen 2Liter rest 3Liter exertion A portable home oxygen concentrator will be obtained from either [employer] or [facility] A home pulmonary rehab consult will be obtained from [facility] or other service  Alpha one antitrypsin levels Return 6 weeks

## 2012-03-05 NOTE — Patient Instructions (Addendum)
Stay on symbicort Start Tudorza one puff twice daily Use oxygen 2Liter rest 3Liter exertion A portable home oxygen concentrator will be obtained from either [employer] or [facility] A home pulmonary rehab consult will be obtained from [facility] or other service  Alpha one antitrypsin levels Return 6 weeks

## 2012-03-05 NOTE — Progress Notes (Signed)
Subjective:    Patient ID: Carolyn [name], female    DOB: 01/21/1940, 72 y.o.   MRN: [id number]  HPI Comments: Dx copd  10  years.  On oxygen , had Flu 02/11/12: hosp at [facility] , nearly expired. Pt was on bipap.  D/c on oxygen.  Pt was at home then CVA two yrs then now in AL center two yrs ago  Shortness of Breath This is a chronic problem. The current episode started more than 1 year ago. The problem occurs daily (exertional only but severe). The problem has been gradually worsening. Associated symptoms include headaches, neck pain, orthopnea, rhinorrhea, sputum production and wheezing. Pertinent negatives include no abdominal pain, chest pain, claudication, coryza, ear pain, fever, hemoptysis, leg pain, leg swelling, PND, rash, sore throat, swollen glands, syncope or vomiting. The symptoms are aggravated by any activity, exercise, odors, fumes, smoke and URIs. Associated symptoms comments: Notes heartburn but diet related. Risk factors include smoking. She has tried beta agonist inhalers and steroid inhalers for the symptoms. The treatment provided significant relief. Her past medical history is significant for asthma, COPD, a heart failure and pneumonia. There is no history of allergies, aspirin allergies, bronchiolitis, CAD, chronic lung disease, DVT, PE or a recent surgery.      Review of Systems  Constitutional: Positive for diaphoresis, appetite change and fatigue. Negative for fever, chills, activity change and unexpected weight change.  HENT: Positive for nosebleeds, congestion, rhinorrhea, sneezing, neck pain, neck stiffness, voice change and postnasal drip. Negative for hearing loss, ear pain, sore throat, facial swelling, mouth sores, trouble swallowing, dental problem, sinus pressure, tinnitus and ear discharge.   Eyes: Positive for discharge and itching. Negative for photophobia and visual disturbance.  Respiratory: Positive for cough, sputum production, chest tightness, shortness of breath  and wheezing. Negative for apnea, hemoptysis, choking and stridor.   Cardiovascular: Positive for orthopnea. Negative for chest pain, palpitations, claudication, leg swelling, syncope and PND.  Gastrointestinal: Positive for nausea. Negative for vomiting, abdominal pain, constipation, blood in stool and abdominal distention.  Genitourinary: Negative for dysuria, urgency, frequency, hematuria, flank pain, decreased urine volume and difficulty urinating.  Musculoskeletal: Positive for arthralgias and gait problem. Negative for myalgias, back pain and joint swelling.  Skin: Negative for color change, pallor and rash.  Neurological: Positive for dizziness and headaches. Negative for tremors, seizures, syncope, speech difficulty, weakness, light-headedness and numbness.  Hematological: Negative for adenopathy. Bruises/bleeds easily.  Psychiatric/Behavioral: Negative for confusion, sleep disturbance and agitation. The patient is not nervous/anxious.        Objective:   Physical Exam Filed Vitals:   03/05/12 0944  BP: 120/60  Pulse: 64  Height: 5' 1.5" (1.562 m)  Weight: 152 lb 8 oz (69.174 kg)  SpO2: 95%    GEX:[id number] ill WF  in no distress,  normal affect, wearing nasal oxygen  ENT: No lesions,  mouth clear,  oropharynx clear, no postnasal drip  Neck: No JVD, no TMG, no carotid bruits  Lungs: No use of accessory muscles, no dullness to percussion, distant bs, exp wheeze  Cardiovascular: RRR, heart sounds normal, no murmur or gallops, no peripheral edema  Abdomen: soft and NT, no HSM,  BS normal  Musculoskeletal: No deformities, no cyanosis or clubbing  Neuro: alert, non focal  Skin: Warm, no lesions or rashes  REcent CXR from 01/2012 hosp stay reviewed [facility]  03/05/2012: FeV1 29% post BD >>Gold D copd   91% on ambulation on 3L oxygen      Assessment &  Plan:   COPD Gold D , frequent exacerbations Gold D Copd with frequent exacerbations and hospital stays FeV1 29% post  BD Plan Stay on symbicort Start Tudorza one puff twice daily Use oxygen 2Liter rest 3Liter exertion A portable home oxygen concentrator will be obtained from either [facility] or [facility] A home pulmonary rehab consult will be obtained from [facility] or other service  Alpha one antitrypsin levels Return 6 weeks   Pulmonary HTN Pulmonary hypertension on basis of Severe copd   Tobacco abuse This pt is currently OFF cigarettes 03/05/2012   Chronic respiratory failure Chronic respiratory failure  Plan Oxygen therapy to continue 2L rest 3L exertion    Updated Medication List Outpatient Encounter Prescriptions as of 03/05/2012  Medication Sig Dispense Refill  . acetaminophen (TYLENOL) 325 MG tablet Take 650 mg by mouth 3 (three) times daily as needed. For pain      . albuterol (PROVENTIL HFA;VENTOLIN HFA) 108 (90 BASE) MCG/ACT inhaler Inhale 2 puffs into the lungs every 4 (four) hours as needed. For shortness of breath      . albuterol (PROVENTIL) (2.5 MG/3ML) 0.083% nebulizer solution Take 3 mLs (2.5 mg total) by nebulization 3 (three) times daily. For wheezing  75 mL  2  . amLODipine (NORVASC) 10 MG tablet Take 10 mg by mouth daily.      . budesonide-formoterol (SYMBICORT) 160-4.5 MCG/ACT inhaler Inhale 2 puffs into the lungs 2 (two) times daily.      . citalopram (CELEXA) 20 MG tablet Take 20 mg by mouth daily.      . clonazePAM (KLONOPIN) 0.5 MG tablet Take 0.25 mg by mouth every 6 (six) hours as needed. For anxiety      . clopidogrel (PLAVIX) 75 MG tablet Take 75 mg by mouth daily.        Marland Kitchen gabapentin (NEURONTIN) 300 MG capsule Take 300 mg by mouth 2 (two) times daily.      . Lactobacillus (ACIDOPHILUS PO) Take 1 tablet by mouth daily.      Marland Kitchen loperamide (IMODIUM A-D) 2 MG tablet Take 2 mg by mouth 4 (four) times daily as needed. For diarrhea      . magnesium oxide (MAG-OX) 400 MG tablet Take 400 mg by mouth 2 (two) times daily.      . metoprolol succinate (TOPROL-XL) 25 MG 24 hr tablet  Take 25 mg by mouth daily.      . ondansetron (ZOFRAN) 4 MG tablet Take 4 mg by mouth every 6 (six) hours as needed. For nausea      . pravastatin (PRAVACHOL) 20 MG tablet Take 20 mg by mouth every evening.       . promethazine (PHENERGAN) 25 MG tablet Take 12.5 mg by mouth every 6 (six) hours as needed. For nausea      . Aclidinium Bromide (TUDORZA PRESSAIR) 400 MCG/ACT AEPB Inhale 1 puff into the lungs 2 (two) times daily.  1 each  6  . [DISCONTINUED] nicotine (NICODERM CQ - DOSED IN MG/24 HOURS) 14 mg/24hr patch Place 1 patch onto the skin daily.  28 patch  0  . [DISCONTINUED] oseltamivir (TAMIFLU) 75 MG capsule Take 1 capsule (75 mg total) by mouth 2 (two) times daily. X 4 more days  8 capsule  0  . [DISCONTINUED] predniSONE (STERAPRED UNI-PAK) 10 MG tablet Prednisone dosing: Take  Prednisone 40mg  (4 tabs) x 3 days, then taper to 30mg  (3 tabs) x 3 days, then 20mg  (2 tabs) x 3days, then 10mg  (1 tab) x  3days, then OFF.  Dispense:  30 tabs, refills: None  30 tablet  0   No facility-administered encounter medications on file as of 03/05/2012.

## 2012-03-05 NOTE — Assessment & Plan Note (Signed)
Pulmonary hypertension on basis of Severe copd

## 2012-03-05 NOTE — Assessment & Plan Note (Signed)
Chronic respiratory failure  Plan Oxygen therapy to continue 2L rest 3L exertion

## 2012-03-06 LAB — ALPHA-1-ANTITRYPSIN: A-1 Antitrypsin, Ser: 154 mg/dL (ref 90–200)

## 2012-03-07 NOTE — Progress Notes (Signed)
Quick Note:  Called, spoke with pt. Informed her of lab results per Dr. Delford Field. She verbalized understanding of this and voiced no further questions or concerns at this time. ______

## 2012-03-07 NOTE — Progress Notes (Signed)
Quick Note:  Call pt and tell her alpha one antitrypsin assay was normal ______

## 2012-03-13 LAB — ALPHA-1 ANTITRYPSIN PHENOTYPE: A-1 Antitrypsin: 181 mg/dL (ref 83–199)

## 2012-03-22 ENCOUNTER — Encounter: Payer: Self-pay | Admitting: Critical Care Medicine

## 2012-03-22 ENCOUNTER — Ambulatory Visit (INDEPENDENT_AMBULATORY_CARE_PROVIDER_SITE_OTHER): Payer: Medicare Other | Admitting: Critical Care Medicine

## 2012-03-22 VITALS — BP 112/66 | HR 69 | Temp 97.0°F | Ht 61.5 in | Wt 166.0 lb

## 2012-03-22 NOTE — Progress Notes (Signed)
Subjective:    Patient ID: Carolyn [name], female    DOB: Oct 09, 1940, 72 y.o.   MRN: [id number]  HPI 03/22/2012 Getting pulm rehab and PT at home.Pt is now better.  Pt walked without oxygen.   No real cough.  Dyspnea is better.  No real chest pain.   Pt denies any significant sore throat, nasal congestion or excess secretions, fever, chills, sweats, unintended weight loss, pleurtic or exertional chest pain, orthopnea PND, or leg swelling Pt denies any increase in rescue therapy over baseline, denies waking up needing it or having any early am or nocturnal exacerbations of coughing/wheezing/or dyspnea. Pt also denies any obvious fluctuation in symptoms with  weather or environmental change or other alleviating or aggravating factors     Review of Systems  Constitutional: Positive for diaphoresis, appetite change and fatigue. Negative for chills, activity change and unexpected weight change.  HENT: Positive for nosebleeds, congestion, sneezing, neck stiffness, voice change and postnasal drip. Negative for hearing loss, facial swelling, mouth sores, trouble swallowing, dental problem, sinus pressure, tinnitus and ear discharge.   Eyes: Positive for discharge and itching. Negative for photophobia and visual disturbance.  Respiratory: Positive for cough and chest tightness. Negative for apnea, choking and stridor.   Cardiovascular: Negative for palpitations.  Gastrointestinal: Positive for nausea. Negative for constipation, blood in stool and abdominal distention.  Genitourinary: Negative for dysuria, urgency, frequency, hematuria, flank pain, decreased urine volume and difficulty urinating.  Musculoskeletal: Positive for arthralgias and gait problem. Negative for myalgias, back pain and joint swelling.  Skin: Negative for color change and pallor.  Neurological: Positive for dizziness. Negative for tremors, seizures, syncope, speech difficulty, weakness, light-headedness and numbness.   Hematological: Negative for adenopathy. Bruises/bleeds easily.  Psychiatric/Behavioral: Negative for confusion, sleep disturbance and agitation. The patient is not nervous/anxious.        Objective:   Physical Exam  Filed Vitals:   03/22/12 1449  BP: 112/66  Pulse: 69  Temp: 97 F (36.1 C)  TempSrc: Oral  Height: 5' 1.5" (1.562 m)  Weight: 166 lb (75.297 kg)  SpO2: 95%    WUJ:[id number] ill WF  in no distress,  normal affect, wearing nasal oxygen  ENT: No lesions,  mouth clear,  oropharynx clear, no postnasal drip  Neck: No JVD, no TMG, no carotid bruits  Lungs: No use of accessory muscles, no dullness to percussion, distant bs,   Cardiovascular: RRR, heart sounds normal, no murmur or gallops, no peripheral edema  Abdomen: soft and NT, no HSM,  BS normal  Musculoskeletal: No deformities, no cyanosis or clubbing  Neuro: alert, non focal  Skin: Warm, no lesions or rashes  REcent CXR from 01/2012 hosp stay reviewed [facility]  03/23/2012: FeV1 29% post BD >>Gold D copd   91% on ambulation on 3L oxygen      Assessment & Plan:   COPD Gold D , frequent exacerbations Gold stage D. COPD with frequent exacerbations now improved Plan Maintain inhaled medications as prescribed Attempt to obtain portable concentrator and simpler concentrator    Updated Medication List Outpatient Encounter Prescriptions as of 03/22/2012  Medication Sig Dispense Refill  . acetaminophen (TYLENOL) 325 MG tablet Take 650 mg by mouth 3 (three) times daily as needed. For pain      . Aclidinium Bromide (TUDORZA PRESSAIR) 400 MCG/ACT AEPB Inhale 1 puff into the lungs 2 (two) times daily.  1 each  6  . albuterol (PROVENTIL HFA;VENTOLIN HFA) 108 (90 BASE) MCG/ACT inhaler Inhale 2 puffs into the  lungs every 4 (four) hours as needed. For shortness of breath      . albuterol (PROVENTIL) (2.5 MG/3ML) 0.083% nebulizer solution Take 2.5 mg by nebulization every 6 (six) hours as needed for wheezing or  shortness of breath. For wheezing      . amLODipine (NORVASC) 10 MG tablet Take 10 mg by mouth daily.      . budesonide-formoterol (SYMBICORT) 160-4.5 MCG/ACT inhaler Inhale 2 puffs into the lungs 2 (two) times daily.      . citalopram (CELEXA) 20 MG tablet Take 20 mg by mouth daily.      . clonazePAM (KLONOPIN) 0.5 MG tablet Take 0.25 mg by mouth every 6 (six) hours as needed. For anxiety      . clopidogrel (PLAVIX) 75 MG tablet Take 75 mg by mouth daily.        Marland Kitchen gabapentin (NEURONTIN) 300 MG capsule Take 300 mg by mouth 2 (two) times daily.      . Lactobacillus (ACIDOPHILUS PO) Take 1 tablet by mouth daily.      Marland Kitchen loperamide (IMODIUM A-D) 2 MG tablet Take 2 mg by mouth 4 (four) times daily as needed. For diarrhea      . magnesium oxide (MAG-OX) 400 MG tablet Take 400 mg by mouth 2 (two) times daily.      . metoprolol succinate (TOPROL-XL) 25 MG 24 hr tablet Take 25 mg by mouth daily.      . ondansetron (ZOFRAN) 4 MG tablet Take 4 mg by mouth every 6 (six) hours as needed. For nausea      . pravastatin (PRAVACHOL) 20 MG tablet Take 20 mg by mouth every evening.       . promethazine (PHENERGAN) 25 MG tablet Take 12.5 mg by mouth every 6 (six) hours as needed. For nausea      . [DISCONTINUED] albuterol (PROVENTIL) (2.5 MG/3ML) 0.083% nebulizer solution Take 3 mLs (2.5 mg total) by nebulization 3 (three) times daily. For wheezing  75 mL  2   No facility-administered encounter medications on file as of 03/22/2012.

## 2012-03-22 NOTE — Patient Instructions (Addendum)
No change in inhaled medications Stay on oxygen 2Liter rest 3liter exertion. A portable oxygen concentrator will be obtained Return 4 months

## 2012-03-23 NOTE — Assessment & Plan Note (Signed)
Gold stage D. COPD with frequent exacerbations now improved Plan Maintain inhaled medications as prescribed Attempt to obtain portable concentrator and simpler concentrator

## 2012-03-27 ENCOUNTER — Telehealth: Payer: Self-pay | Admitting: Critical Care Medicine

## 2012-03-27 NOTE — Telephone Encounter (Signed)
lmtcb x1 for pt. 

## 2012-03-27 NOTE — Telephone Encounter (Signed)
I spoke with the [place]. He stated the insurance is denying the [place]. They will cover spiriva. Please advise Dr. Delford Field thanks

## 2012-03-27 NOTE — Telephone Encounter (Signed)
Switch to spiriva. Give samples

## 2012-03-28 MED ORDER — TIOTROPIUM BROMIDE MONOHYDRATE 18 MCG IN CAPS: 18.0000 ug | ORAL_CAPSULE | Freq: Every day | RESPIRATORY_TRACT | Status: DC

## 2012-03-28 NOTE — Telephone Encounter (Signed)
Returning call.Carolyn [name] ° °

## 2012-03-28 NOTE — Telephone Encounter (Signed)
Noted and will sign later

## 2012-03-28 NOTE — Telephone Encounter (Signed)
Kendra aware at [facility] that patient will be switched to Spiriva 1 inhalation daily. Pts ex-husband to come and pick up the samples for the patient to take to [facility]. This message will be printed and faxed to [phone] which Enrique Sack stated will suffice as Dr Delford Field order to change this medication, for they do not take verbal orders.  Rx has been printed for the Spiriva and will be placed in Dr Lynelle Doctor "TO DO" folder. Once signed the Rx will need to be faxed to [facility] @ fax # [phone].  Aware that Dr Delford Field out of office but this will be signed once PW back in office.  Will send this message to [place] and Dr Delford Field as Lorain Childes that there is a Rx to be signed for the patient.

## 2012-03-29 ENCOUNTER — Telehealth: Payer: Self-pay | Admitting: Critical Care Medicine

## 2012-03-29 NOTE — Telephone Encounter (Signed)
Spiriva rx signed and faxed to [facility].  Collin aware spiriva rx has been faxed and this will replace [place].    Called, spoke with pt.  Informed her [employer] American has been changed to Spiriva.  Advised he she has any problems or concerns on how to use it, please call us back.  Pt also to call back if she has any problems with the medication or breathing worsens while on it.  She verbalized understanding of this and voiced no further questions or concerns at this time.

## 2012-03-29 NOTE — Telephone Encounter (Signed)
Spoke with pharmacist, Orpah Clinton and notified that the rx was already sent this am He states that he was already made aware of this and nothing further needed

## 2012-04-16 ENCOUNTER — Ambulatory Visit: Payer: Medicare Other | Admitting: Critical Care Medicine

## 2012-09-21 ENCOUNTER — Inpatient Hospital Stay (HOSPITAL_COMMUNITY)
Admission: EM | Admit: 2012-09-21 | Discharge: 2012-09-22 | DRG: 315 | Disposition: A | Discharge status: Nursing Home (Includes ICF) | Payer: Medicare Other | Attending: Internal Medicine | Admitting: Internal Medicine

## 2012-09-21 ENCOUNTER — Emergency Department (HOSPITAL_COMMUNITY): Payer: Medicare Other

## 2012-09-21 ENCOUNTER — Encounter (HOSPITAL_COMMUNITY): Payer: Self-pay | Admitting: Emergency Medicine

## 2012-09-21 DIAGNOSIS — I10 Essential (primary) hypertension: Secondary | ICD-10-CM | POA: Diagnosis present

## 2012-09-21 DIAGNOSIS — J9610 Chronic respiratory failure, unspecified whether with hypoxia or hypercapnia: Secondary | ICD-10-CM | POA: Diagnosis present

## 2012-09-21 DIAGNOSIS — R1110 Vomiting, unspecified: Secondary | ICD-10-CM

## 2012-09-21 DIAGNOSIS — N390 Urinary tract infection, site not specified: Secondary | ICD-10-CM | POA: Diagnosis present

## 2012-09-21 DIAGNOSIS — E861 Hypovolemia: Secondary | ICD-10-CM | POA: Diagnosis present

## 2012-09-21 DIAGNOSIS — I959 Hypotension, unspecified: Principal | ICD-10-CM | POA: Diagnosis present

## 2012-09-21 DIAGNOSIS — J449 Chronic obstructive pulmonary disease, unspecified: Secondary | ICD-10-CM | POA: Diagnosis present

## 2012-09-21 DIAGNOSIS — R6889 Other general symptoms and signs: Secondary | ICD-10-CM

## 2012-09-21 DIAGNOSIS — Z9981 Dependence on supplemental oxygen: Secondary | ICD-10-CM

## 2012-09-21 DIAGNOSIS — J069 Acute upper respiratory infection, unspecified: Secondary | ICD-10-CM | POA: Diagnosis present

## 2012-09-21 DIAGNOSIS — E860 Dehydration: Secondary | ICD-10-CM | POA: Diagnosis present

## 2012-09-21 DIAGNOSIS — Z880 Allergy status to penicillin: Secondary | ICD-10-CM

## 2012-09-21 DIAGNOSIS — Z79899 Other long term (current) drug therapy: Secondary | ICD-10-CM

## 2012-09-21 DIAGNOSIS — Z8673 Personal history of transient ischemic attack (TIA), and cerebral infarction without residual deficits: Secondary | ICD-10-CM

## 2012-09-21 DIAGNOSIS — R197 Diarrhea, unspecified: Secondary | ICD-10-CM | POA: Diagnosis present

## 2012-09-21 DIAGNOSIS — R112 Nausea with vomiting, unspecified: Secondary | ICD-10-CM | POA: Diagnosis present

## 2012-09-21 DIAGNOSIS — Z66 Do not resuscitate: Secondary | ICD-10-CM | POA: Diagnosis present

## 2012-09-21 DIAGNOSIS — Z87891 Personal history of nicotine dependence: Secondary | ICD-10-CM

## 2012-09-21 DIAGNOSIS — J4489 Other specified chronic obstructive pulmonary disease: Secondary | ICD-10-CM | POA: Diagnosis present

## 2012-09-21 DIAGNOSIS — I5032 Chronic diastolic (congestive) heart failure: Secondary | ICD-10-CM | POA: Diagnosis present

## 2012-09-21 LAB — CBC WITH DIFFERENTIAL/PLATELET
Basophils Absolute: 0 10*3/uL (ref 0.0–0.1)
Basophils Relative: 0 % (ref 0–1)
Eosinophils Relative: 2 % (ref 0–5)
Lymphocytes Relative: 28 % (ref 12–46)
MCHC: 31.9 g/dL (ref 30.0–36.0)
Neutro Abs: 3.8 10*3/uL (ref 1.7–7.7)
Platelets: 211 10*3/uL (ref 150–400)
RDW: 13.6 % (ref 11.5–15.5)
WBC: 6.1 10*3/uL (ref 4.0–10.5)

## 2012-09-21 LAB — URINALYSIS, ROUTINE W REFLEX MICROSCOPIC
Glucose, UA: NEGATIVE mg/dL
Hgb urine dipstick: NEGATIVE
Specific Gravity, Urine: 1.033 — ABNORMAL HIGH (ref 1.005–1.030)
Urobilinogen, UA: 1 mg/dL (ref 0.0–1.0)
pH: 5.5 (ref 5.0–8.0)

## 2012-09-21 LAB — LIPASE, BLOOD: Lipase: 13 U/L (ref 11–59)

## 2012-09-21 LAB — COMPREHENSIVE METABOLIC PANEL
ALT: 7 U/L (ref 0–35)
AST: 9 U/L (ref 0–37)
Albumin: 3.5 g/dL (ref 3.5–5.2)
CO2: 38 mEq/L — ABNORMAL HIGH (ref 19–32)
Calcium: 10.2 mg/dL (ref 8.4–10.5)
GFR calc non Af Amer: 66 mL/min — ABNORMAL LOW (ref >90)
Sodium: 138 mEq/L (ref 135–145)

## 2012-09-21 LAB — URINE MICROSCOPIC-ADD ON

## 2012-09-21 LAB — CG4 I-STAT (LACTIC ACID): Lactic Acid, Venous: 0.52 mmol/L (ref 0.5–2.2)

## 2012-09-21 MED ORDER — GABAPENTIN 300 MG PO CAPS
600.0000 mg | ORAL_CAPSULE | Freq: Every day | ORAL | Status: DC
  Administered 2012-09-21: 22:00:00 600 mg via ORAL
  Filled 2012-09-21 (×2): qty 2

## 2012-09-21 MED ORDER — ONDANSETRON HCL 4 MG/2ML IJ SOLN
4.0000 mg | Freq: Once | INTRAMUSCULAR | Status: AC
  Administered 2012-09-21: 4 mg via INTRAVENOUS
  Filled 2012-09-21: qty 2

## 2012-09-21 MED ORDER — IPRATROPIUM BROMIDE 0.02 % IN SOLN
0.5000 mg | Freq: Four times a day (QID) | RESPIRATORY_TRACT | Status: DC
  Administered 2012-09-21 – 2012-09-22 (×4): 0.5 mg via RESPIRATORY_TRACT
  Filled 2012-09-21 (×4): qty 2.5

## 2012-09-21 MED ORDER — SODIUM CHLORIDE 0.9 % IV SOLN
INTRAVENOUS | Status: DC
  Administered 2012-09-21 – 2012-09-22 (×2): via INTRAVENOUS

## 2012-09-21 MED ORDER — SODIUM CHLORIDE 0.9 % IV BOLUS (SEPSIS)
1000.0000 mL | Freq: Once | INTRAVENOUS | Status: AC
  Administered 2012-09-21: 1000 mL via INTRAVENOUS

## 2012-09-21 MED ORDER — SIMVASTATIN 10 MG PO TABS
10.0000 mg | ORAL_TABLET | Freq: Every day | ORAL | Status: DC
  Filled 2012-09-21: qty 1

## 2012-09-21 MED ORDER — ONDANSETRON HCL 4 MG/2ML IJ SOLN: 4.0000 mg | Freq: Four times a day (QID) | INTRAMUSCULAR | Status: DC | PRN

## 2012-09-21 MED ORDER — IPRATROPIUM BROMIDE 0.02 % IN SOLN
0.5000 mg | RESPIRATORY_TRACT | Status: DC | PRN
  Administered 2012-09-21: 0.5 mg via RESPIRATORY_TRACT
  Filled 2012-09-21: qty 2.5

## 2012-09-21 MED ORDER — ONDANSETRON HCL 4 MG/2ML IJ SOLN: 4.0000 mg | Freq: Three times a day (TID) | INTRAMUSCULAR | Status: DC | PRN

## 2012-09-21 MED ORDER — CITALOPRAM HYDROBROMIDE 20 MG PO TABS
20.0000 mg | ORAL_TABLET | Freq: Every day | ORAL | Status: DC
  Administered 2012-09-22: 20 mg via ORAL
  Filled 2012-09-21: qty 1

## 2012-09-21 MED ORDER — GABAPENTIN 300 MG PO CAPS
300.0000 mg | ORAL_CAPSULE | ORAL | Status: DC
  Administered 2012-09-22 (×2): 300 mg via ORAL
  Filled 2012-09-21 (×3): qty 1

## 2012-09-21 MED ORDER — ALBUTEROL SULFATE (5 MG/ML) 0.5% IN NEBU
2.5000 mg | INHALATION_SOLUTION | RESPIRATORY_TRACT | Status: DC | PRN
  Administered 2012-09-21: 2.5 mg via RESPIRATORY_TRACT
  Filled 2012-09-21: qty 0.5

## 2012-09-21 MED ORDER — SODIUM CHLORIDE 0.9 % IJ SOLN
3.0000 mL | Freq: Two times a day (BID) | INTRAMUSCULAR | Status: DC
  Administered 2012-09-22: 10:00:00 3 mL via INTRAVENOUS

## 2012-09-21 MED ORDER — CIPROFLOXACIN HCL 500 MG PO TABS
500.0000 mg | ORAL_TABLET | Freq: Once | ORAL | Status: AC
  Administered 2012-09-21: 500 mg via ORAL
  Filled 2012-09-21: qty 1

## 2012-09-21 MED ORDER — ENOXAPARIN SODIUM 40 MG/0.4ML ~~LOC~~ SOLN
40.0000 mg | SUBCUTANEOUS | Status: DC
  Administered 2012-09-21: 23:00:00 40 mg via SUBCUTANEOUS
  Filled 2012-09-21 (×2): qty 0.4

## 2012-09-21 MED ORDER — MAGNESIUM CITRATE PO SOLN: 1.0000 | Freq: Once | ORAL | Status: AC | PRN

## 2012-09-21 MED ORDER — ONDANSETRON HCL 4 MG PO TABS: 4.0000 mg | ORAL_TABLET | Freq: Four times a day (QID) | ORAL | Status: DC | PRN

## 2012-09-21 MED ORDER — ALBUTEROL SULFATE (5 MG/ML) 0.5% IN NEBU
2.5000 mg | INHALATION_SOLUTION | Freq: Four times a day (QID) | RESPIRATORY_TRACT | Status: DC
  Administered 2012-09-21 – 2012-09-22 (×4): 2.5 mg via RESPIRATORY_TRACT
  Filled 2012-09-21 (×4): qty 0.5

## 2012-09-21 MED ORDER — MAGNESIUM OXIDE 400 (241.3 MG) MG PO TABS
400.0000 mg | ORAL_TABLET | Freq: Two times a day (BID) | ORAL | Status: DC
  Administered 2012-09-21 – 2012-09-22 (×2): 400 mg via ORAL
  Filled 2012-09-21 (×3): qty 1

## 2012-09-21 MED ORDER — POLYETHYLENE GLYCOL 3350 17 G PO PACK
17.0000 g | PACK | Freq: Every day | ORAL | Status: DC | PRN
  Filled 2012-09-21: qty 1

## 2012-09-21 MED ORDER — DIAZEPAM 5 MG PO TABS: 5.0000 mg | ORAL_TABLET | Freq: Three times a day (TID) | ORAL | Status: DC | PRN

## 2012-09-21 MED ORDER — CLOPIDOGREL BISULFATE 75 MG PO TABS
75.0000 mg | ORAL_TABLET | Freq: Every day | ORAL | Status: DC
  Administered 2012-09-21 – 2012-09-22 (×2): 75 mg via ORAL
  Filled 2012-09-21 (×3): qty 1

## 2012-09-21 MED ORDER — ACETAMINOPHEN 325 MG PO TABS: 650.0000 mg | ORAL_TABLET | Freq: Four times a day (QID) | ORAL | Status: DC | PRN

## 2012-09-21 MED ORDER — OXYCODONE HCL 5 MG PO TABS: 5.0000 mg | ORAL_TABLET | ORAL | Status: DC | PRN

## 2012-09-21 MED ORDER — CIPROFLOXACIN HCL 500 MG PO TABS
500.0000 mg | ORAL_TABLET | Freq: Two times a day (BID) | ORAL | Status: DC
  Administered 2012-09-21 – 2012-09-22 (×2): 500 mg via ORAL
  Filled 2012-09-21 (×4): qty 1

## 2012-09-21 MED ORDER — PANTOPRAZOLE SODIUM 40 MG PO TBEC
40.0000 mg | DELAYED_RELEASE_TABLET | Freq: Every day | ORAL | Status: DC
  Administered 2012-09-22: 40 mg via ORAL
  Filled 2012-09-21 (×2): qty 1

## 2012-09-21 MED ORDER — BISACODYL 10 MG RE SUPP: 10.0000 mg | Freq: Every day | RECTAL | Status: DC | PRN

## 2012-09-21 MED ORDER — BUDESONIDE-FORMOTEROL FUMARATE 160-4.5 MCG/ACT IN AERO
2.0000 | INHALATION_SPRAY | Freq: Two times a day (BID) | RESPIRATORY_TRACT | Status: DC
  Administered 2012-09-21 – 2012-09-22 (×2): 2 via RESPIRATORY_TRACT
  Filled 2012-09-21: qty 6

## 2012-09-21 MED ORDER — ALBUTEROL SULFATE (5 MG/ML) 0.5% IN NEBU
5.0000 mg | INHALATION_SOLUTION | Freq: Once | RESPIRATORY_TRACT | Status: DC
  Filled 2012-09-21: qty 1

## 2012-09-21 NOTE — ED Notes (Signed)
Phlebotomy at bedside.

## 2012-09-21 NOTE — ED Notes (Addendum)
Pt from [facility]. Pt complains of n/v/d for past 2 days. O2 sat 73% on RA at nursing facility. EMS arrived, pt put on 4L [place] and 02 sat 94%. Pt has hx of copd. Pt given phenergan at nursing facility. O2 sat 80% on RA on arrival

## 2012-09-21 NOTE — ED Provider Notes (Signed)
CSN: [id number]     Arrival date & time 09/21/12  0841 History   First MD Initiated Contact with Patient 09/21/12 0848     Chief Complaint  Patient presents with  . Shortness of Breath  . Emesis  . Diarrhea   (Consider location/radiation/quality/duration/timing/severity/associated sxs/prior Treatment) HPI Comments: Patient presents from an assisted living facility. She states she's had a two-day history of some flulike symptoms including myalgias and chills. She's had a mild cough. She noted this morning she woke up with vomiting and diarrhea. She's had multiple episodes of nonbloody nonbilious emesis and nonbloody diarrhea. She was noted to have an oxygen saturation of 73% on room air at the nursing facility. Patient says she is on chronic oxygen at 2 L which occasionally is bumped up to 3 or 4 L. She currently has a normal oxygen saturation and she's denies any respiratory difficulties over the last few days. She says her breathing is at baseline. She's had a mild cough that's not significantly different than her normal cough. She denies any chest pain. She denies abdominal pain. She has had some chills but no known fevers.  Patient is a 72 y.o. female presenting with shortness of breath, vomiting, and diarrhea.  Shortness of Breath Associated symptoms: cough and vomiting   Associated symptoms: no abdominal pain, no chest pain, no diaphoresis, no fever, no headaches and no rash   Emesis Associated symptoms: chills, diarrhea and myalgias   Associated symptoms: no abdominal pain, no arthralgias and no headaches   Diarrhea Associated symptoms: chills, myalgias and vomiting   Associated symptoms: no abdominal pain, no arthralgias, no diaphoresis, no fever and no headaches     Past Medical History  Diagnosis Date  . Hypertension   . COPD (chronic obstructive pulmonary disease)   . Anemia   . Headaches, cluster   . Wears glasses   . CVA (cerebral vascular accident) 01/2010   Past Surgical  History  Procedure Laterality Date  . Abdominal hysterectomy    . Varicose vein surgery    . Left hand    . Cholecystectomy  07/20/10    Dr Andrey Campanile; Lap chole with ioc   Family History  Problem Relation Age of Onset  . Heart disease Father   . Heart disease Mother   . Diabetes Father   . Hypertension Mother   . Cancer Maternal Aunt     ovarian, breast  . Lung disease Father    History  Substance Use Topics  . Smoking status: Former Smoker -- 1.00 packs/day for 30 years    Types: Cigarettes    Quit date: 02/03/2012  . Smokeless tobacco: Never Used  . Alcohol Use: No   OB History   Grav Para Term Preterm Abortions TAB SAB Ect Mult Living                 Review of Systems  Constitutional: Positive for chills and fatigue. Negative for fever and diaphoresis.  HENT: Positive for congestion and rhinorrhea. Negative for sneezing.   Eyes: Negative.   Respiratory: Positive for cough. Negative for chest tightness and shortness of breath.   Cardiovascular: Negative for chest pain and leg swelling.  Gastrointestinal: Positive for vomiting and diarrhea. Negative for nausea, abdominal pain and blood in stool.  Genitourinary: Negative for frequency, hematuria, flank pain and difficulty urinating.  Musculoskeletal: Positive for myalgias. Negative for back pain and arthralgias.  Skin: Negative for rash.  Neurological: Positive for light-headedness. Negative for dizziness, speech difficulty, weakness, numbness  and headaches.    Allergies  Codeine and Penicillins  Home Medications   Current Outpatient Rx  Name  Route  Sig  Dispense  Refill  . acetaminophen (TYLENOL) 325 MG tablet   Oral   Take 650 mg by mouth 3 (three) times daily as needed. For pain         . albuterol (PROVENTIL HFA;VENTOLIN HFA) 108 (90 BASE) MCG/ACT inhaler   Inhalation   Inhale 2 puffs into the lungs every 4 (four) hours as needed. For shortness of breath         . albuterol (PROVENTIL) (2.5 MG/3ML)  0.083% nebulizer solution   Nebulization   Take 2.5 mg by nebulization every 6 (six) hours as needed for wheezing or shortness of breath. For wheezing         . amLODipine (NORVASC) 10 MG tablet   Oral   Take 10 mg by mouth daily.         . budesonide-formoterol (SYMBICORT) 160-4.5 MCG/ACT inhaler   Inhalation   Inhale 2 puffs into the lungs 2 (two) times daily.         . citalopram (CELEXA) 20 MG tablet   Oral   Take 20 mg by mouth daily.         . clopidogrel (PLAVIX) 75 MG tablet   Oral   Take 75 mg by mouth at bedtime.         . diazepam (VALIUM) 5 MG tablet   Oral   Take 5 mg by mouth every 8 (eight) hours as needed for anxiety or sleep.         Marland Kitchen gabapentin (NEURONTIN) 300 MG capsule   Oral   Take 300 mg by mouth 2 (two) times daily. Takes at 8a and 2p         . gabapentin (NEURONTIN) 300 MG capsule   Oral   Take 600 mg by mouth at bedtime.         . Lactobacillus (ACIDOPHILUS EXTRA STRENGTH PO)   Oral   Take 1 tablet by mouth daily.         Marland Kitchen loperamide (IMODIUM A-D) 2 MG tablet   Oral   Take 2 mg by mouth 4 (four) times daily as needed. For diarrhea         . magnesium oxide (MAG-OX) 400 MG tablet   Oral   Take 400 mg by mouth 2 (two) times daily.         . metoprolol succinate (TOPROL-XL) 25 MG 24 hr tablet   Oral   Take 25 mg by mouth daily.         . ondansetron (ZOFRAN) 4 MG tablet   Oral   Take 4 mg by mouth every 6 (six) hours as needed. For nausea         . pravastatin (PRAVACHOL) 20 MG tablet   Oral   Take 20 mg by mouth every evening.          . promethazine (PHENERGAN) 25 MG tablet   Oral   Take 12.5 mg by mouth every 6 (six) hours as needed. For nausea          BP 88/42  Pulse 78  Temp(Src) 98.8 F (37.1 C) (Oral)  Resp 18  SpO2 89% Physical Exam  Constitutional: She is oriented to person, place, and time. She appears well-developed and well-nourished.  HENT:  Head: Normocephalic and atraumatic.   Slightly dry mucous membranes  Eyes: Pupils are equal, round,  and reactive to light.  Neck: Normal range of motion. Neck supple.  Cardiovascular: Normal rate, regular rhythm and normal heart sounds.   Pulmonary/Chest: Effort normal and breath sounds normal. No respiratory distress. She has no wheezes. She has no rales. She exhibits no tenderness.  Abdominal: Soft. Bowel sounds are normal. There is no tenderness. There is no rebound and no guarding.  Musculoskeletal: Normal range of motion. She exhibits no edema.  Lymphadenopathy:    She has no cervical adenopathy.  Neurological: She is alert and oriented to person, place, and time.  Skin: Skin is warm and dry. No rash noted.  Psychiatric: She has a normal mood and affect.    ED Course  Procedures (including critical care time) Labs Review Results for orders placed during the hospital encounter of 09/21/12  CBC WITH DIFFERENTIAL      Result Value Range   WBC 6.1  4.0 - 10.5 K/uL   RBC 4.04  3.87 - 5.11 MIL/uL   Hemoglobin 12.9  12.0 - 15.0 g/dL   HCT 09.8  11.9 - 14.7 %   MCV 100.2 (*) 78.0 - 100.0 fL   MCH 31.9  26.0 - 34.0 pg   MCHC 31.9  30.0 - 36.0 g/dL   RDW 82.9  56.2 - 13.0 %   Platelets 211  150 - 400 K/uL   Neutrophils Relative % 63  43 - 77 %   Neutro Abs 3.8  1.7 - 7.7 K/uL   Lymphocytes Relative 28  12 - 46 %   Lymphs Abs 1.7  0.7 - 4.0 K/uL   Monocytes Relative 7  3 - 12 %   Monocytes Absolute 0.4  0.1 - 1.0 K/uL   Eosinophils Relative 2  0 - 5 %   Eosinophils Absolute 0.1  0.0 - 0.7 K/uL   Basophils Relative 0  0 - 1 %   Basophils Absolute 0.0  0.0 - 0.1 K/uL  COMPREHENSIVE METABOLIC PANEL      Result Value Range   Sodium 138  135 - 145 mEq/L   Potassium 4.2  3.5 - 5.1 mEq/L   Chloride 93 (*) 96 - 112 mEq/L   CO2 38 (*) 19 - 32 mEq/L   Glucose, Bld 95  70 - 99 mg/dL   BUN 16  6 - 23 mg/dL   Creatinine, Ser 8.65  0.50 - 1.10 mg/dL   Calcium 78.4  8.4 - 69.6 mg/dL   Total Protein 6.2  6.0 - 8.3 g/dL    Albumin 3.5  3.5 - 5.2 g/dL   AST 9  0 - 37 U/L   ALT 7  0 - 35 U/L   Alkaline Phosphatase 75  39 - 117 U/L   Total Bilirubin 0.3  0.3 - 1.2 mg/dL   GFR calc non Af Amer 66 (*) >90 mL/min   GFR calc Af Amer 76 (*) >90 mL/min  URINALYSIS, ROUTINE W REFLEX MICROSCOPIC      Result Value Range   Color, Urine AMBER (*) YELLOW   APPearance CLOUDY (*) CLEAR   Specific Gravity, Urine 1.033 (*) 1.005 - 1.030   pH 5.5  5.0 - 8.0   Glucose, UA NEGATIVE  NEGATIVE mg/dL   Hgb urine dipstick NEGATIVE  NEGATIVE   Bilirubin Urine LARGE (*) NEGATIVE   Ketones, ur 40 (*) NEGATIVE mg/dL   Protein, ur NEGATIVE  NEGATIVE mg/dL   Urobilinogen, UA 1.0  0.0 - 1.0 mg/dL   Nitrite NEGATIVE  NEGATIVE   Leukocytes,  UA SMALL (*) NEGATIVE  LIPASE, BLOOD      Result Value Range   Lipase 13  11 - 59 U/L  URINE MICROSCOPIC-ADD ON      Result Value Range   Squamous Epithelial / LPF RARE  RARE   WBC, UA 3-6  <3 WBC/hpf   Bacteria, UA MANY (*) RARE   Dg Chest 2 View  09/21/2012   *RADIOLOGY REPORT*  Clinical Data: Cough and chills  CHEST - 2 VIEW  Comparison: 01/09/2012  Findings: The heart and pulmonary vascularity are within normal limits.  The lungs are clear bilaterally.  Mild hyperinflation is again noted.  No focal infiltrate or sizable effusion is seen.  IMPRESSION: COPD without acute abnormality.   Original Report Authenticated By: Alcide Clever, M.D.     Imaging Review Dg Chest 2 View  09/21/2012   *RADIOLOGY REPORT*  Clinical Data: Cough and chills  CHEST - 2 VIEW  Comparison: 01/09/2012  Findings: The heart and pulmonary vascularity are within normal limits.  The lungs are clear bilaterally.  Mild hyperinflation is again noted.  No focal infiltrate or sizable effusion is seen.  IMPRESSION: COPD without acute abnormality.   Original Report Authenticated By: Alcide Clever, M.D.    MDM   1. UTI (lower urinary tract infection)   2. Vomiting   3. Hypotension    Patient presents with two-day history of  flulike symptoms and associated vomiting diarrhea that started today. She was given IV fluids and Zofran looks much better after that. She was tolerating by mouth fluids. She was afebrile here in the ED. She's mentating normally. Her blood pressure however has been persistently low. She was given 2 L IV fluids and her blood pressure now is in the 80s. She did not appear to be symptomatic with hypotension but given the persistent hypotension I did go ahead and consult hospitalist for admission. I did add a lactate level as well although she otherwise does not appear to be septic. She was given antibiotics for possible urinary tract infection.    Rolan Bucco, MD 09/21/12 [phone]

## 2012-09-21 NOTE — ED Notes (Signed)
Bed: NG29 Expected date:  Expected time:  Means of arrival:  Comments: Ems, elderly, flu like symptoms x2 days

## 2012-09-21 NOTE — ED Notes (Signed)
Patient transported to X-ray 

## 2012-09-21 NOTE — H&P (Signed)
[facility] History and Physical  [name] AVW:[id number] DOB: [birth year] DOA: 09/21/2012  Referring physician: Dr Fredderick Phenix PCP: Eartha Inch, MD  Specialists: Pulmonary: Dr Delford Field  Chief Complaint: flu like symptoms/ nausea emesis  HPI: Carolyn [name] is a 72 y.o. female  With hx of COPD Gold D on chronic home oxygen 2-3 L, HTN, hx of CVA who presents from ALF with a 3 day hx of nausea, emesis, loose stools, flu like symptoms with myalgias, chills, subjective fevers, decreased appetite, HA, lethargy, urinary incontinence. Patient sister endorses some worsening confusion and odor to urine and worried about UTI. Patient denies any chest pain, no shortness of breath, no constipation. Patient endorses some wheezing and generalized weakness. Patient seen in the ED urinalysis was nitrite negative small leukocytes 3-6 WBCs this was gravity of 1.033, comprehensive metabolic profile with a chloride of 93 bicarbonate of 30 urinalysis was within normal limits. Lipase was normal at 13. CBC was unremarkable. Chest x-ray was negative for any acute infiltrates. Patient was given 2 L of IV fluids however with no significant improvement in her blood pressure which remained at high 80s to low 90s we were consulted to admit the patient.  Review of Systems: The patient denies anorexia, fever, weight loss,, vision loss, decreased hearing, hoarseness, chest pain, syncope, dyspnea on exertion, peripheral edema, balance deficits, hemoptysis, abdominal pain, melena, hematochezia, severe indigestion/heartburn, hematuria, incontinence, genital sores, muscle weakness, suspicious skin lesions, transient blindness, difficulty walking, depression, unusual weight change, abnormal bleeding, enlarged lymph nodes, angioedema, and breast masses.    Past Medical History  Diagnosis Date  . Hypertension   . COPD (chronic obstructive pulmonary disease)   . Anemia   . Headaches, cluster   . Wears glasses   . CVA (cerebral  vascular accident) 01/2010   Past Surgical History  Procedure Laterality Date  . Abdominal hysterectomy    . Varicose vein surgery    . Left hand    . Cholecystectomy  07/20/10    Dr Andrey Campanile; Lap chole with ioc   Social History:  reports that she quit smoking about 7 months ago. Her smoking use included Cigarettes. She has a 30 pack-year smoking history. She has never used smokeless tobacco. She reports that she does not drink alcohol or use illicit drugs.  Allergies  Allergen Reactions  . Codeine Other (See Comments)    Reaction unknown  . Penicillins Other (See Comments)    Reaction unknown    Family History  Problem Relation Age of Onset  . Heart disease Father   . Heart disease Mother   . Diabetes Father   . Hypertension Mother   . Cancer Maternal Aunt     ovarian, breast  . Lung disease Father     Prior to Admission medications   Medication Sig Start Date End Date Taking? Authorizing Provider  acetaminophen (TYLENOL) 325 MG tablet Take 650 mg by mouth 3 (three) times daily as needed. For pain   Yes Historical Provider, MD  albuterol (PROVENTIL HFA;VENTOLIN HFA) 108 (90 BASE) MCG/ACT inhaler Inhale 2 puffs into the lungs every 4 (four) hours as needed. For shortness of breath   Yes Historical Provider, MD  albuterol (PROVENTIL) (2.5 MG/3ML) 0.083% nebulizer solution Take 2.5 mg by nebulization every 6 (six) hours as needed for wheezing or shortness of breath. For wheezing 01/14/12  Yes Ripudeep K Rai, MD  amLODipine (NORVASC) 10 MG tablet Take 10 mg by mouth daily.   Yes Historical Provider, MD  budesonide-formoterol [facility])  160-4.5 MCG/ACT inhaler Inhale 2 puffs into the lungs 2 (two) times daily.   Yes Historical Provider, MD  citalopram (CELEXA) 20 MG tablet Take 20 mg by mouth daily.   Yes Historical Provider, MD  clopidogrel (PLAVIX) 75 MG tablet Take 75 mg by mouth at bedtime.   Yes Historical Provider, MD  diazepam (VALIUM) 5 MG tablet Take 5 mg by mouth every 8  (eight) hours as needed for anxiety or sleep.   Yes Historical Provider, MD  gabapentin (NEURONTIN) 300 MG capsule Take 300 mg by mouth 2 (two) times daily. Takes at 8a and 2p   Yes Historical Provider, MD  gabapentin (NEURONTIN) 300 MG capsule Take 600 mg by mouth at bedtime.   Yes Historical Provider, MD  Lactobacillus (ACIDOPHILUS EXTRA STRENGTH PO) Take 1 tablet by mouth daily.   Yes Historical Provider, MD  loperamide (IMODIUM A-D) 2 MG tablet Take 2 mg by mouth 4 (four) times daily as needed. For diarrhea   Yes Historical Provider, MD  magnesium oxide (MAG-OX) 400 MG tablet Take 400 mg by mouth 2 (two) times daily.   Yes Historical Provider, MD  metoprolol succinate (TOPROL-XL) 25 MG 24 hr tablet Take 25 mg by mouth daily.   Yes Historical Provider, MD  ondansetron (ZOFRAN) 4 MG tablet Take 4 mg by mouth every 6 (six) hours as needed. For nausea   Yes Historical Provider, MD  pravastatin (PRAVACHOL) 20 MG tablet Take 20 mg by mouth every evening.    Yes Historical Provider, MD  promethazine (PHENERGAN) 25 MG tablet Take 12.5 mg by mouth every 6 (six) hours as needed. For nausea   Yes Historical Provider, MD  tiotropium (SPIRIVA) 18 MCG inhalation capsule Place 18 mcg into inhaler and inhale daily.   Yes Historical Provider, MD   Physical Exam: Filed Vitals:   09/21/12 1700  BP: 101/56  Pulse: 78  Temp:   Resp: 25     General:  Well-developed well-nourished in no acute cardiopulmonary distress. Speaking in full sentences.  Eyes: Pupils equal round reactive to light and accommodation. Extraocular movements intact.   ENT: Oropharynx is clear, no lesions, no exudates. Dry mucous membranes.  Neck: Supple with no lymphadenopathy.  Cardiovascular: Regular rate rhythm no murmurs rubs or gallops. No JVD. No lower extremity edema.  Respiratory: Clear to auscultation bilaterally. No wheezing. Fair air movement. No use of accessory muscles of respiration. Speaking in full  sentences.  Abdomen: Soft, nontender, nondistended, positive bowel sounds.  Skin: Poor skin turgor. No lesions, no rashes.  Musculoskeletal: 5/5 BUE strength, 5/5 BLE strength  Psychiatric: Normal mood. Normal affect. Fair insight. Fair judgment.  Neurologic: Alert and oriented x3. Cranial nerves II through XII are grossly intact. No focal deficits.  Labs on Admission:  Basic Metabolic Panel:  Recent Labs Lab 09/21/12 1000  NA 138  K 4.2  CL 93*  CO2 38*  GLUCOSE 95  BUN 16  CREATININE 0.88  CALCIUM 10.2   Liver Function Tests:  Recent Labs Lab 09/21/12 1000  AST 9  ALT 7  ALKPHOS 75  BILITOT 0.3  PROT 6.2  ALBUMIN 3.5    Recent Labs Lab 09/21/12 1000  LIPASE 13   No results found for this basename: AMMONIA,  in the last 168 hours CBC:  Recent Labs Lab 09/21/12 1000  WBC 6.1  NEUTROABS 3.8  HGB 12.9  HCT 40.5  MCV 100.2*  PLT 211   Cardiac Enzymes: No results found for this basename: CKTOTAL, CKMB, CKMBINDEX,  TROPONINI,  in the last 168 hours  BNP (last 3 results) No results found for this basename: PROBNP,  in the last 8760 hours CBG: No results found for this basename: GLUCAP,  in the last 168 hours  Radiological Exams on Admission: Dg Chest 2 View  09/21/2012   *RADIOLOGY REPORT*  Clinical Data: Cough and chills  CHEST - 2 VIEW  Comparison: 01/09/2012  Findings: The heart and pulmonary vascularity are within normal limits.  The lungs are clear bilaterally.  Mild hyperinflation is again noted.  No focal infiltrate or sizable effusion is seen.  IMPRESSION: COPD without acute abnormality.   Original Report Authenticated By: Alcide Clever, M.D.    EKG: Independently reviewed. Normal sinus rhythm  Assessment/Plan Principal Problem:   Hypotension Active Problems:   COPD Gold D , frequent exacerbations   H/O: CVA (cerebrovascular accident)   Hypertension   Dehydration   Diarrhea   Chronic diastolic heart failure, NYHA class 1   Chronic  respiratory failure   N&V (nausea and vomiting)   UTI (lower urinary tract infection)   Flu-like symptoms  #1 hypotension Likely secondary to volume depletion/hypovolemia secondary to GI losses from nausea vomiting loose stools in the setting of antihypertensive medications. EKG with a normal sinus rhythm. Patient denies any chest pain. Chest x-ray is negative for any acute infiltrates. Urinalysis with small leukocytes negative nitrate 3-6 WBCs. Random cortisol is pending. Patient with no overt GI bleed. Patient's hemoglobin is stable. Some improvement of blood pressure to IV fluids. We'll give a bolus of IV fluids. IV hydration. Hold antihypertensive medications. Follow.  #2 flulike symptoms Patient presented with flulike symptoms of myalgias headaches chills subjective fevers. Will check influenza panel PCR. Place on IV fluids. Next treatments every 6 hours for the next 24-48 hours. Supportive care.  #3 dehydration Secondary to GI losses. IV fluids.  #4 loose stools/diarrhea Patient states stools are not watery. Last bowel movement was around 4 AM on the day of admission. IV fluids. Supportive care. Follow.  #5 questionable UTI/bacteriuria Urine cultures are pending. We'll place on oral ciprofloxacin empirically while awaiting urine cultures.  #6 chronic respiratory failure/COPD Gold stage D. Patient with chronic COPD on chronic home oxygen. Patient states her wrist very status is at baseline. Continue home regimen of Symbicort. We'll place on meds secondary to problem #2. Follow.  #7 history of CVA Stable. Continue Plavix for secondary stroke prevention.  #8 prophylaxis PPI for GI prophylaxis. Lovenox for DVT prophylaxis.   Code Status: DO NOT RESUSCITATE Family Communication: Updated patient, sister, daughter at bedside. Disposition Plan: Admit to telemetry.  Time spent: 70 mins  [facility] Pager [phone]  If 7PM-7AM, please contact  night-coverage [email or web address] Password [facility] 09/21/2012, 5:21 PM

## 2012-09-21 NOTE — Progress Notes (Signed)
Utilization Review completed.  Jenayah Antu RN CM  

## 2012-09-21 NOTE — ED Notes (Signed)
EDP Belfi aware of blood pressure.

## 2012-09-21 NOTE — ED Notes (Signed)
Pt to be changed to telemetry

## 2012-09-21 NOTE — ED Notes (Signed)
Pt given coffee. °

## 2012-09-22 ENCOUNTER — Encounter (HOSPITAL_COMMUNITY): Payer: Self-pay | Admitting: *Deleted

## 2012-09-22 DIAGNOSIS — I5032 Chronic diastolic (congestive) heart failure: Secondary | ICD-10-CM

## 2012-09-22 LAB — IRON AND TIBC
Iron: 59 ug/dL (ref 42–135)
Saturation Ratios: 29 % (ref 20–55)
TIBC: 205 ug/dL — ABNORMAL LOW (ref 250–470)

## 2012-09-22 LAB — INFLUENZA PANEL BY PCR (TYPE A & B): Influenza B By PCR: NEGATIVE

## 2012-09-22 LAB — COMPREHENSIVE METABOLIC PANEL
Albumin: 2.7 g/dL — ABNORMAL LOW (ref 3.5–5.2)
Alkaline Phosphatase: 56 U/L (ref 39–117)
BUN: 10 mg/dL (ref 6–23)
Calcium: 9.1 mg/dL (ref 8.4–10.5)
Creatinine, Ser: 0.82 mg/dL (ref 0.50–1.10)
GFR calc Af Amer: 83 mL/min — ABNORMAL LOW (ref >90)
Glucose, Bld: 84 mg/dL (ref 70–99)
Total Protein: 4.8 g/dL — ABNORMAL LOW (ref 6.0–8.3)

## 2012-09-22 LAB — CBC
HCT: 32.1 % — ABNORMAL LOW (ref 36.0–46.0)
MCV: 101.3 fL — ABNORMAL HIGH (ref 78.0–100.0)
RBC: 3.17 MIL/uL — ABNORMAL LOW (ref 3.87–5.11)
RDW: 13.7 % (ref 11.5–15.5)
WBC: 5.1 10*3/uL (ref 4.0–10.5)

## 2012-09-22 LAB — URINE CULTURE: Colony Count: >=100000

## 2012-09-22 NOTE — Progress Notes (Signed)
Report called to [facility], EMS to transport pt back today.

## 2012-09-22 NOTE — Discharge Summary (Signed)
Physician Discharge Summary  Carolyn [name] ZOX:[id number] DOB: 10-May-1940 DOA: 09/21/2012  PCP: Eartha Inch, MD  Admit date: 09/21/2012 Discharge date: 09/22/2012  Time spent: 60 minutes  Recommendations for Outpatient Follow-up:  1. Patient is to followup with Eartha Inch, MD one week post discharge. Patient's blood pressure medications have been discontinued and will need to be followed up upon as outpatient. Patient will need a basic metabolic profile done to followup on electrolytes and renal function.   Discharge Diagnoses:  Principal Problem:   Hypotension Active Problems:   COPD Gold D , frequent exacerbations   H/O: CVA (cerebrovascular accident)   Hypertension   Dehydration   Diarrhea   Chronic diastolic heart failure, NYHA class 1   Chronic respiratory failure   N&V (nausea and vomiting)   UTI (lower urinary tract infection)   Flu-like symptoms   Discharge Condition: Stable and improved  Diet recommendation: Heart healthy  Filed Weights   09/21/12 1854 09/22/12 0709  Weight: 68.8 kg (151 lb 10.8 oz) 65.3 kg (143 lb 15.4 oz)    History of present illness:  With hx of COPD Gold D on chronic home oxygen 2-3 L, HTN, hx of CVA who presents from ALF with a 3 day hx of nausea, emesis, loose stools, flu like symptoms with myalgias, chills, subjective fevers, decreased appetite, HA, lethargy, urinary incontinence. Patient sister endorses some worsening confusion and odor to urine and worried about UTI. Patient denies any chest pain, no shortness of breath, no constipation. Patient endorses some wheezing and generalized weakness.  Patient seen in the ED urinalysis was nitrite negative small leukocytes 3-6 WBCs this was gravity of 1.033, comprehensive metabolic profile with a chloride of 93 bicarbonate of 30 urinalysis was within normal limits. Lipase was normal at 13. CBC was unremarkable. Chest x-ray was negative for any acute infiltrates. Patient was given 2 L of IV  fluids however with no significant improvement in her blood pressure which remained at high 80s to low 90s we were consulted to admit the patient.      Hospital Course: #1 Hypotension Patient was admitted with hypotension felt to be secondary to volume depletion in the setting of GI loss from nausea vomiting and loose stools as well as the background of antihypertensive medications. EKG which was done showed a normal sinus rhythm. Patient denied any chest pain. Chest x-ray was negative for any acute infiltrates. Urinalysis had small leukocytes negative nitrite with 3-6 WBCs. Urine cultures were negative. Patient had no overt GI bleed and a hemoglobin remained stable. Cortisol level obtained was 4.8. Patient was hydrated with IV fluids and the blood pressure responded to IV fluids. Patient was tolerating a diet. On day of discharge patient's blood pressure was 118/53. Patient will be discharged home off her antihypertensive medications and will followup with PCP as outpatient.  #2 viral upper respiratory syndrome On admission patient had presented with flulike symptoms of myalgias headache subjective fevers and chills. Felt to likely be a viral respiratory infection. Patient improved clinically. Influenza panel PCR which was obtained was negative. Patient was placed on supportive care and be discharged in stable and improved condition.  #3 dehydration On admission patient was noted to be dehydrated. Patient was hydrated with IV fluids and was euvolemic by day of discharge.  #4 loose stools Patient had some complaints of loose stools. Patient did not have any further loose stools during the hospitalization. This was placed on supportive care will followup with PCP as outpatient.  #5 bacteriuria  On admission urinalysis had small leukocytes negative nitrites and 3-6 WBCs. Due to concern for possible urinary tract infection patient was started empirically on oral ciprofloxacin. Urine cultures came back  with greater than 100,000 multiple bacterial morphotypes. Antibiotics were discontinued patient does not need any further antibiotic therapy.  The rest of patient's chronic medical issues were stable throughout the hospitalization and patient was discharged in stable and improved condition.   Procedures:  CXR 09/21/12    Consultations:  None  Discharge Exam: Filed Vitals:   09/22/12 1428  BP: 118/53  Pulse: 86  Temp:   Resp: 18    General: NAD Cardiovascular: RRR Respiratory: CTAB  Discharge Instructions  Discharge Orders   Future Orders Complete By Expires   Diet - low sodium heart healthy  As directed    Discharge instructions  As directed    Comments:     Follow up with BADGER,MICHAEL C, MD in 1 week.   Increase activity slowly  As directed        Medication List    STOP taking these medications       amLODipine 10 MG tablet  Commonly known as:  NORVASC      TAKE these medications       acetaminophen 325 MG tablet  Commonly known as:  TYLENOL  Take 650 mg by mouth 3 (three) times daily as needed. For pain     ACIDOPHILUS EXTRA STRENGTH PO  Take 1 tablet by mouth daily.     albuterol 108 (90 BASE) MCG/ACT inhaler  Commonly known as:  PROVENTIL HFA;VENTOLIN HFA  Inhale 2 puffs into the lungs every 4 (four) hours as needed. For shortness of breath     albuterol (2.5 MG/3ML) 0.083% nebulizer solution  Commonly known as:  PROVENTIL  Take 2.5 mg by nebulization every 6 (six) hours as needed for wheezing or shortness of breath. For wheezing     budesonide-formoterol 160-4.5 MCG/ACT inhaler  Commonly known as:  SYMBICORT  Inhale 2 puffs into the lungs 2 (two) times daily.     citalopram 20 MG tablet  Commonly known as:  CELEXA  Take 20 mg by mouth daily.     clopidogrel 75 MG tablet  Commonly known as:  PLAVIX  Take 75 mg by mouth at bedtime.     diazepam 5 MG tablet  Commonly known as:  VALIUM  Take 5 mg by mouth every 8 (eight) hours as needed  for anxiety or sleep.     gabapentin 300 MG capsule  Commonly known as:  NEURONTIN  Take 300 mg by mouth 2 (two) times daily. Takes at 8a and 2p     gabapentin 300 MG capsule  Commonly known as:  NEURONTIN  Take 600 mg by mouth at bedtime.     loperamide 2 MG tablet  Commonly known as:  IMODIUM A-D  Take 2 mg by mouth 4 (four) times daily as needed. For diarrhea     magnesium oxide 400 MG tablet  Commonly known as:  MAG-OX  Take 400 mg by mouth 2 (two) times daily.     metoprolol succinate 25 MG 24 hr tablet  Commonly known as:  TOPROL-XL  Take 25 mg by mouth daily.     ondansetron 4 MG tablet  Commonly known as:  ZOFRAN  Take 4 mg by mouth every 6 (six) hours as needed. For nausea     pravastatin 20 MG tablet  Commonly known as:  PRAVACHOL  Take 20 mg  by mouth every evening.     promethazine 25 MG tablet  Commonly known as:  PHENERGAN  Take 12.5 mg by mouth every 6 (six) hours as needed. For nausea     tiotropium 18 MCG inhalation capsule  Commonly known as:  SPIRIVA  Place 18 mcg into inhaler and inhale daily.       Allergies  Allergen Reactions  . Penicillins Other (See Comments)    Reaction unknown  . Codeine Rash    Reaction unknown       Follow-up Information   Follow up with BADGER,MICHAEL C, MD. Schedule an appointment as soon as possible for a visit in 1 week.   Specialty:  Family Medicine   Contact information:   [address] [place] [address] [phone]        The results of significant diagnostics from this hospitalization (including imaging, microbiology, ancillary and laboratory) are listed below for reference.    Significant Diagnostic Studies: Dg Chest 2 View  09/21/2012   *RADIOLOGY REPORT*  Clinical Data: Cough and chills  CHEST - 2 VIEW  Comparison: 01/09/2012  Findings: The heart and pulmonary vascularity are within normal limits.  The lungs are clear bilaterally.  Mild hyperinflation is again noted.  No focal  infiltrate or sizable effusion is seen.  IMPRESSION: COPD without acute abnormality.   Original Report Authenticated By: Alcide Clever, M.D.    Microbiology: Recent Results (from the past 240 hour(s))  URINE CULTURE     Status: None   Collection Time    09/21/12  9:49 AM      Result Value Range Status   Specimen Description URINE, CLEAN CATCH   Final   Special Requests NONE   Final   Culture  Setup Time     Final   Value: 09/21/2012 12:42     Performed at [facility] Count     Final   Value: >=100,000 COLONIES/ML     Performed at [facility]   Culture     Final   Value: Multiple bacterial morphotypes present, none predominant. Suggest appropriate recollection if clinically indicated.     Performed at [facility]   Report Status 09/22/2012 FINAL   Final  MRSA PCR SCREENING     Status: None   Collection Time    09/21/12 10:48 PM      Result Value Range Status   MRSA by PCR NEGATIVE  NEGATIVE Final   Comment:            The GeneXpert MRSA Assay (FDA     approved for NASAL specimens     only), is one component of a     comprehensive MRSA colonization     surveillance program. It is not     intended to diagnose MRSA     infection nor to guide or     monitor treatment for     MRSA infections.     Labs: Basic Metabolic Panel:  Recent Labs Lab 09/21/12 1000 09/22/12 0455  NA 138 136  K 4.2 4.0  CL 93* 100  CO2 38* 34*  GLUCOSE 95 84  BUN 16 10  CREATININE 0.88 0.82  CALCIUM 10.2 9.1  MG 2.6*  --    Liver Function Tests:  Recent Labs Lab 09/21/12 1000 09/22/12 0455  AST 9 8  ALT 7 5  ALKPHOS 75 56  BILITOT 0.3 0.2*  PROT 6.2 4.8*  ALBUMIN 3.5 2.7*  Recent Labs Lab 09/21/12 1000  LIPASE 13   No results found for this basename: AMMONIA,  in the last 168 hours CBC:  Recent Labs Lab 09/21/12 1000 09/22/12 0455  WBC 6.1 5.1  NEUTROABS 3.8  --   HGB 12.9 10.0*  HCT 40.5 32.1*  MCV 100.2* 101.3*  PLT 211 178    Cardiac Enzymes: No results found for this basename: CKTOTAL, CKMB, CKMBINDEX, TROPONINI,  in the last 168 hours BNP: BNP (last 3 results) No results found for this basename: PROBNP,  in the last 8760 hours CBG: No results found for this basename: GLUCAP,  in the last 168 hours     Signed:  THOMPSON,DANIEL  [facility] 09/22/2012, 2:38 PM

## 2012-09-22 NOTE — Evaluation (Signed)
Physical Therapy Evaluation Patient Details Name: Carolyn [name] MRN: [id number] DOB: 09-25-1940 Today's Date: 09/22/2012 Time: 1202-1226 PT Time Calculation (min): 24 min  PT Assessment / Plan / Recommendation History of Present Illness  With hx of COPD Gold D on chronic home oxygen 2-3 L, HTN, hx of CVA who presents from ALF with a 3 day hx of nausea, emesis, loose stools, flu like symptoms with myalgias, chills, subjective fevers, decreased appetite, HA, lethargy, urinary incontinence. Patient sister endorses some worsening confusion and odor to urine and worried about UTI. Patient denies any chest pain, no shortness of breath, no constipation. Patient endorses some wheezing and generalized weakness.  Clinical Impression  Pt will benefit from PT to address deficits below    PT Assessment  Patient needs continued PT services    Follow Up Recommendations  Home health PT    Does the patient have the potential to tolerate intense rehabilitation      Barriers to Discharge        Equipment Recommendations  None recommended by PT    Recommendations for Other Services     Frequency Min 3X/week    Precautions / Restrictions Precautions Precautions: Fall Precaution Comments: O2 dependent   Pertinent Vitals/Pain Sats at rest on 2L 84% Sats while amb 62% but difficulty getting accurate reading  Pt without SOB/DOE      Mobility  Bed Mobility Bed Mobility: Supine to Sit;Sit to Supine Supine to Sit: 5: Supervision Sit to Supine: 5: Supervision Details for Bed Mobility Assistance: for safety and line management Transfers Transfers: Sit to Stand;Stand to Sit Sit to Stand: 4: Min guard;5: Supervision;From bed;From toilet Stand to Sit: To toilet;To bed;5: Supervision;4: Min guard Details for Transfer Assistance: cues for safety awareness, and O2 line management Ambulation/Gait Ambulation/Gait Assistance: 4: Min guard Ambulation Distance (Feet): 30 Feet (10') Assistive device:  Rolling walker Ambulation/Gait Assistance Details: cues for breathing and safety; LOB x 3 with min to recover; pt wanted to push IV pole and dynamap, more steady with bil UE support, did not want to use walker; Gait Pattern: Decreased stride length    Exercises     PT Diagnosis: Difficulty walking  PT Problem List: Decreased strength;Decreased range of motion;Decreased activity tolerance;Decreased balance;Decreased mobility;Decreased safety awareness;Decreased knowledge of precautions;Decreased knowledge of use of DME PT Treatment Interventions: DME instruction;Gait training;Functional mobility training;Therapeutic activities;Therapeutic exercise;Patient/family education;Balance training     PT Goals(Current goals can be found in the care plan section) Acute Rehab PT Goals Patient Stated Goal: to go home to [facility] PT Goal Formulation: With patient Time For Goal Achievement: 09/29/12 Potential to Achieve Goals: Good  Visit Information  Last PT Received On: 09/22/12 Assistance Needed: +1 History of Present Illness: With hx of COPD Gold D on chronic home oxygen 2-3 L, HTN, hx of CVA who presents from ALF with a 3 day hx of nausea, emesis, loose stools, flu like symptoms with myalgias, chills, subjective fevers, decreased appetite, HA, lethargy, urinary incontinence. Patient sister endorses some worsening confusion and odor to urine and worried about UTI. Patient denies any chest pain, no shortness of breath, no constipation. Patient endorses some wheezing and generalized weakness.       Prior Functioning  Home Living Family/patient expects to be discharged to:: Assisted living ([facility]) Home Equipment: Krystal Clark - 2 wheels Additional Comments: pt sleeps with 3 pillows behind head at night, states she is short of breath when in supine. Prior Function Level of Independence: Independent with assistive  device(s) Comments: pushes wheelchair  to amb so she can put her O2 in  it  Communication Communication: No difficulties    Cognition  Cognition Arousal/Alertness: Awake/alert Behavior During Therapy: WFL for tasks assessed/performed Overall Cognitive Status: Within Functional Limits for tasks assessed Memory:  (pt sister reported more confusion per MD notes)    Extremity/Trunk Assessment Upper Extremity Assessment Upper Extremity Assessment: Overall WFL for tasks assessed Lower Extremity Assessment Lower Extremity Assessment: Overall WFL for tasks assessed   Balance    End of Session PT - End of Session Equipment Utilized During Treatment: Gait belt Activity Tolerance: Patient tolerated treatment well Nurse Communication: Mobility status  GP     [facility] 09/22/2012, 1:06 PM

## 2012-09-22 NOTE — Progress Notes (Signed)
   CARE MANAGEMENT NOTE 09/22/2012  Patient:  Carolyn [name], Carolyn [name]   Account Number:  [id number]  Date Initiated:  09/22/2012  Documentation initiated by:  [facility]  Subjective/Objective Assessment:     Action/Plan:   Anticipated DC Date:  09/22/2012   Anticipated DC Plan:  HOME W HOME HEALTH SERVICES      DC Planning Services  CM consult      Choice offered to / List presented to:             Status of service:  Completed, signed off Medicare Important Message given?   (If response is "NO", the following Medicare IM given date fields will be blank) Date Medicare IM given:   Date Additional Medicare IM given:    Discharge Disposition:  HOME W HOME HEALTH SERVICES  Per UR Regulation:    If discussed at Long Length of Stay Meetings, dates discussed:    Comments:  09/22/2012 1800 NCM spoke to pt and states she will use the ALF Physical Therapist. States they provided therapy at her facility. Will fax orders to facility # [phone]. Isidoro Donning RN CCM Case Mgmt phone [phone]

## 2012-09-22 NOTE — Progress Notes (Signed)
Clinical Social Work Department BRIEF PSYCHOSOCIAL ASSESSMENT 09/22/2012  Patient:  Carolyn [name], Carolyn [name]     Account Number:  [id number]     Admit date:  09/21/2012  Clinical Social Worker:  Doroteo Glassman  Date/Time:  09/22/2012 03:59 PM  Referred by:  Physician  Date Referred:  09/22/2012 Referred for  ALF Placement   Other Referral:   Interview type:  Other - See comment Other interview type:   Pt's daughter, Mrs. Gracelyn Nurse, via phone    PSYCHOSOCIAL DATA Living Status:  FACILITY Admitted from facility:  [facility] Level of care:  Assisted Living Primary support name:  Mrs. Gracelyn Nurse Primary support relationship to patient:  CHILD, ADULT Degree of support available:   strong    CURRENT CONCERNS Current Concerns  Post-Acute Placement   Other Concerns:    SOCIAL WORK ASSESSMENT / PLAN Per MD, Pt ready for d/c.    Spoke with Pt's daughter who confirmed that Pt is from [facility] and that she will be returning upon d/c. Daughter made aware that Pt ready for d/c.  Daughter asking that CSW arrange for ambulance transportation.    CSW thanked Pt's daughter for her time.    Provided [facility] with FL2 and d/c summary.    Facility ready to receive Pt.    Arranged for ambulance transportation.    Pt to be d/c'd.   Assessment/plan status:  No Further Intervention Required Other assessment/ plan:   Information/referral to community resources:    PATIENT'S/FAMILY'S RESPONSE TO PLAN OF CARE: Pt's daughter happy for Pt to return to return to [facility] but is uncomfortable providing transportation.  CSW to arrange for EMS transport.    Pt's daughter thanked CSW for time and assistance.   Providence Crosby, LCSWA Clinical Social Work [phone]

## 2012-09-23 LAB — VITAMIN B12: Vitamin B-12: 232 pg/mL (ref 211–911)

## 2012-09-23 LAB — FOLATE: Folate: 4.6 ng/mL

## 2012-11-10 HISTORY — PX: HIP PINNING,CANNULATED: SHX1758

## 2012-11-18 ENCOUNTER — Emergency Department (HOSPITAL_COMMUNITY): Payer: Medicare Other

## 2012-11-18 ENCOUNTER — Encounter (HOSPITAL_COMMUNITY): Payer: Self-pay | Admitting: Emergency Medicine

## 2012-11-18 ENCOUNTER — Inpatient Hospital Stay (HOSPITAL_COMMUNITY)
Admission: EM | Admit: 2012-11-18 | Discharge: 2012-11-20 | DRG: 481 | Disposition: A | Discharge status: Home Health | Payer: Medicare Other | Attending: Internal Medicine | Admitting: Internal Medicine

## 2012-11-18 ENCOUNTER — Inpatient Hospital Stay (HOSPITAL_COMMUNITY): Payer: Medicare Other | Admitting: Anesthesiology

## 2012-11-18 ENCOUNTER — Inpatient Hospital Stay (HOSPITAL_COMMUNITY): Payer: Medicare Other

## 2012-11-18 ENCOUNTER — Encounter (HOSPITAL_COMMUNITY)
Admission: EM | Disposition: A | Discharge status: Home Health | Payer: Self-pay | Source: Home / Self Care | Attending: Internal Medicine

## 2012-11-18 ENCOUNTER — Encounter (HOSPITAL_COMMUNITY): Payer: Medicare Other | Admitting: Anesthesiology

## 2012-11-18 DIAGNOSIS — W010XXA Fall on same level from slipping, tripping and stumbling without subsequent striking against object, initial encounter: Secondary | ICD-10-CM | POA: Diagnosis present

## 2012-11-18 DIAGNOSIS — Z9981 Dependence on supplemental oxygen: Secondary | ICD-10-CM

## 2012-11-18 DIAGNOSIS — Z8673 Personal history of transient ischemic attack (TIA), and cerebral infarction without residual deficits: Secondary | ICD-10-CM

## 2012-11-18 DIAGNOSIS — W19XXXD Unspecified fall, subsequent encounter: Secondary | ICD-10-CM

## 2012-11-18 DIAGNOSIS — S72009A Fracture of unspecified part of neck of unspecified femur, initial encounter for closed fracture: Principal | ICD-10-CM

## 2012-11-18 DIAGNOSIS — Z8041 Family history of malignant neoplasm of ovary: Secondary | ICD-10-CM

## 2012-11-18 DIAGNOSIS — J9610 Chronic respiratory failure, unspecified whether with hypoxia or hypercapnia: Secondary | ICD-10-CM | POA: Diagnosis present

## 2012-11-18 DIAGNOSIS — I10 Essential (primary) hypertension: Secondary | ICD-10-CM | POA: Diagnosis present

## 2012-11-18 DIAGNOSIS — Z9089 Acquired absence of other organs: Secondary | ICD-10-CM

## 2012-11-18 DIAGNOSIS — I5032 Chronic diastolic (congestive) heart failure: Secondary | ICD-10-CM | POA: Diagnosis present

## 2012-11-18 DIAGNOSIS — S72002A Fracture of unspecified part of neck of left femur, initial encounter for closed fracture: Secondary | ICD-10-CM

## 2012-11-18 DIAGNOSIS — J4489 Other specified chronic obstructive pulmonary disease: Secondary | ICD-10-CM | POA: Diagnosis present

## 2012-11-18 DIAGNOSIS — I69998 Other sequelae following unspecified cerebrovascular disease: Secondary | ICD-10-CM

## 2012-11-18 DIAGNOSIS — Y9210 Unspecified residential institution as the place of occurrence of the external cause: Secondary | ICD-10-CM | POA: Diagnosis present

## 2012-11-18 DIAGNOSIS — R29898 Other symptoms and signs involving the musculoskeletal system: Secondary | ICD-10-CM | POA: Diagnosis present

## 2012-11-18 DIAGNOSIS — W19XXXA Unspecified fall, initial encounter: Secondary | ICD-10-CM

## 2012-11-18 DIAGNOSIS — I509 Heart failure, unspecified: Secondary | ICD-10-CM | POA: Diagnosis present

## 2012-11-18 DIAGNOSIS — J449 Chronic obstructive pulmonary disease, unspecified: Secondary | ICD-10-CM | POA: Diagnosis present

## 2012-11-18 DIAGNOSIS — R05 Cough: Secondary | ICD-10-CM

## 2012-11-18 DIAGNOSIS — Z803 Family history of malignant neoplasm of breast: Secondary | ICD-10-CM

## 2012-11-18 DIAGNOSIS — Z7901 Long term (current) use of anticoagulants: Secondary | ICD-10-CM

## 2012-11-18 DIAGNOSIS — Z888 Allergy status to other drugs, medicaments and biological substances status: Secondary | ICD-10-CM

## 2012-11-18 DIAGNOSIS — Z79899 Other long term (current) drug therapy: Secondary | ICD-10-CM

## 2012-11-18 DIAGNOSIS — Z7902 Long term (current) use of antithrombotics/antiplatelets: Secondary | ICD-10-CM

## 2012-11-18 DIAGNOSIS — Z8249 Family history of ischemic heart disease and other diseases of the circulatory system: Secondary | ICD-10-CM

## 2012-11-18 DIAGNOSIS — Z87891 Personal history of nicotine dependence: Secondary | ICD-10-CM

## 2012-11-18 DIAGNOSIS — Z880 Allergy status to penicillin: Secondary | ICD-10-CM

## 2012-11-18 DIAGNOSIS — D62 Acute posthemorrhagic anemia: Secondary | ICD-10-CM | POA: Diagnosis not present

## 2012-11-18 DIAGNOSIS — Z833 Family history of diabetes mellitus: Secondary | ICD-10-CM

## 2012-11-18 HISTORY — DX: Other specified postprocedural states: Z98.890

## 2012-11-18 HISTORY — DX: Nausea with vomiting, unspecified: R11.2

## 2012-11-18 HISTORY — DX: Other complications of anesthesia, initial encounter: T88.59XA

## 2012-11-18 HISTORY — DX: Shortness of breath: R06.02

## 2012-11-18 HISTORY — DX: Adverse effect of unspecified anesthetic, initial encounter: T41.45XA

## 2012-11-18 HISTORY — DX: Unspecified osteoarthritis, unspecified site: M19.90

## 2012-11-18 HISTORY — PX: HIP PINNING,CANNULATED: SHX1758

## 2012-11-18 LAB — BASIC METABOLIC PANEL
Calcium: 10.1 mg/dL (ref 8.4–10.5)
Creatinine, Ser: 0.85 mg/dL (ref 0.50–1.10)
GFR calc non Af Amer: 68 mL/min — ABNORMAL LOW (ref >90)
Glucose, Bld: 81 mg/dL (ref 70–99)
Sodium: 143 mEq/L (ref 135–145)

## 2012-11-18 LAB — URINE MICROSCOPIC-ADD ON

## 2012-11-18 LAB — URINALYSIS, ROUTINE W REFLEX MICROSCOPIC
Glucose, UA: NEGATIVE mg/dL
Hgb urine dipstick: NEGATIVE
Protein, ur: NEGATIVE mg/dL
Specific Gravity, Urine: 1.013 (ref 1.005–1.030)
Urobilinogen, UA: 0.2 mg/dL (ref 0.0–1.0)
pH: 7 (ref 5.0–8.0)

## 2012-11-18 LAB — CBC WITH DIFFERENTIAL/PLATELET
Basophils Absolute: 0 10*3/uL (ref 0.0–0.1)
Eosinophils Absolute: 0.2 10*3/uL (ref 0.0–0.7)
Eosinophils Relative: 3 % (ref 0–5)
MCH: 31.7 pg (ref 26.0–34.0)
MCV: 100.3 fL — ABNORMAL HIGH (ref 78.0–100.0)
Platelets: 271 10*3/uL (ref 150–400)
RDW: 13.1 % (ref 11.5–15.5)

## 2012-11-18 LAB — MRSA PCR SCREENING: MRSA by PCR: NEGATIVE

## 2012-11-18 SURGERY — FIXATION, FEMUR, NECK, PERCUTANEOUS, USING SCREW
Anesthesia: General | Site: Hip | Laterality: Left | Wound class: Clean

## 2012-11-18 MED ORDER — ALBUTEROL SULFATE (5 MG/ML) 0.5% IN NEBU: 2.5000 mg | INHALATION_SOLUTION | RESPIRATORY_TRACT | Status: DC | PRN

## 2012-11-18 MED ORDER — FENTANYL CITRATE 0.05 MG/ML IJ SOLN
25.0000 ug | INTRAMUSCULAR | Status: DC | PRN
  Administered 2012-11-18: 50 ug via INTRAVENOUS
  Administered 2012-11-18: 25 ug via INTRAVENOUS
  Administered 2012-11-18: 50 ug via INTRAVENOUS
  Administered 2012-11-18: 25 ug via INTRAVENOUS

## 2012-11-18 MED ORDER — NICOTINE 21 MG/24HR TD PT24
21.0000 mg | MEDICATED_PATCH | Freq: Every day | TRANSDERMAL | Status: DC
  Administered 2012-11-18 – 2012-11-20 (×3): 21 mg via TRANSDERMAL
  Filled 2012-11-18 (×3): qty 1

## 2012-11-18 MED ORDER — CEFAZOLIN SODIUM-DEXTROSE 2-3 GM-% IV SOLR
INTRAVENOUS | Status: AC
  Administered 2012-11-18: 2 g via INTRAVENOUS
  Filled 2012-11-18: qty 50

## 2012-11-18 MED ORDER — ALUM & MAG HYDROXIDE-SIMETH 200-200-20 MG/5ML PO SUSP
30.0000 mL | Freq: Four times a day (QID) | ORAL | Status: DC | PRN
  Administered 2012-11-19: 30 mL via ORAL
  Filled 2012-11-18: qty 30

## 2012-11-18 MED ORDER — HYDROCODONE-ACETAMINOPHEN 5-325 MG PO TABS: 1.0000 | ORAL_TABLET | ORAL | Status: AC | PRN

## 2012-11-18 MED ORDER — CEFAZOLIN SODIUM-DEXTROSE 2-3 GM-% IV SOLR: 2.0000 g | INTRAVENOUS | Status: DC

## 2012-11-18 MED ORDER — ONDANSETRON HCL 4 MG PO TABS: 4.0000 mg | ORAL_TABLET | Freq: Four times a day (QID) | ORAL | Status: DC | PRN

## 2012-11-18 MED ORDER — METOPROLOL SUCCINATE ER 25 MG PO TB24
25.0000 mg | ORAL_TABLET | Freq: Every day | ORAL | Status: DC
Start: 2012-11-19 — End: 2012-11-20
  Administered 2012-11-19 – 2012-11-20 (×2): 25 mg via ORAL
  Filled 2012-11-18 (×2): qty 1

## 2012-11-18 MED ORDER — IPRATROPIUM BROMIDE 0.02 % IN SOLN: 0.5000 mg | RESPIRATORY_TRACT | Status: DC | PRN

## 2012-11-18 MED ORDER — LACTATED RINGERS IV SOLN
INTRAVENOUS | Status: DC | PRN
  Administered 2012-11-18 (×3): via INTRAVENOUS

## 2012-11-18 MED ORDER — SUCCINYLCHOLINE CHLORIDE 20 MG/ML IJ SOLN
INTRAMUSCULAR | Status: DC | PRN
  Administered 2012-11-18: 80 mg via INTRAVENOUS

## 2012-11-18 MED ORDER — ONDANSETRON HCL 4 MG/2ML IJ SOLN: 4.0000 mg | Freq: Three times a day (TID) | INTRAMUSCULAR | Status: DC | PRN

## 2012-11-18 MED ORDER — LACTATED RINGERS IV SOLN
INTRAVENOUS | Status: DC
  Administered 2012-11-18: 23:00:00 via INTRAVENOUS

## 2012-11-18 MED ORDER — ACETAMINOPHEN 650 MG RE SUPP: 650.0000 mg | Freq: Four times a day (QID) | RECTAL | Status: DC | PRN

## 2012-11-18 MED ORDER — ONDANSETRON HCL 4 MG/2ML IJ SOLN
4.0000 mg | Freq: Four times a day (QID) | INTRAMUSCULAR | Status: DC | PRN
  Administered 2012-11-18: 4 mg via INTRAVENOUS
  Filled 2012-11-18: qty 2

## 2012-11-18 MED ORDER — METOCLOPRAMIDE HCL 10 MG PO TABS: 5.0000 mg | ORAL_TABLET | Freq: Three times a day (TID) | ORAL | Status: DC | PRN

## 2012-11-18 MED ORDER — OXYBUTYNIN CHLORIDE ER 10 MG PO TB24
10.0000 mg | ORAL_TABLET | Freq: Every day | ORAL | Status: DC
  Administered 2012-11-19 – 2012-11-20 (×2): 10 mg via ORAL
  Filled 2012-11-18 (×2): qty 1

## 2012-11-18 MED ORDER — OXYCODONE HCL 5 MG PO TABS
5.0000 mg | ORAL_TABLET | ORAL | Status: DC | PRN
  Administered 2012-11-18: 5 mg via ORAL
  Filled 2012-11-18: qty 1

## 2012-11-18 MED ORDER — ENOXAPARIN SODIUM 30 MG/0.3ML ~~LOC~~ SOLN
30.0000 mg | SUBCUTANEOUS | Status: DC
  Administered 2012-11-19 – 2012-11-20 (×2): 30 mg via SUBCUTANEOUS
  Filled 2012-11-18 (×3): qty 0.3

## 2012-11-18 MED ORDER — GABAPENTIN 300 MG PO CAPS
300.0000 mg | ORAL_CAPSULE | ORAL | Status: DC
  Administered 2012-11-19 – 2012-11-20 (×3): 300 mg via ORAL
  Filled 2012-11-18 (×6): qty 1

## 2012-11-18 MED ORDER — PHENOL 1.4 % MT LIQD
1.0000 | OROMUCOSAL | Status: DC | PRN
  Filled 2012-11-18: qty 177

## 2012-11-18 MED ORDER — ACETAMINOPHEN 325 MG PO TABS
650.0000 mg | ORAL_TABLET | Freq: Four times a day (QID) | ORAL | Status: DC | PRN
  Administered 2012-11-18 – 2012-11-20 (×7): 650 mg via ORAL
  Filled 2012-11-18 (×7): qty 2

## 2012-11-18 MED ORDER — FENTANYL CITRATE 0.05 MG/ML IJ SOLN
INTRAMUSCULAR | Status: AC
  Filled 2012-11-18: qty 2

## 2012-11-18 MED ORDER — FLEET ENEMA 7-19 GM/118ML RE ENEM: 1.0000 | ENEMA | Freq: Once | RECTAL | Status: AC | PRN

## 2012-11-18 MED ORDER — SIMVASTATIN 10 MG PO TABS
10.0000 mg | ORAL_TABLET | Freq: Every day | ORAL | Status: DC
  Administered 2012-11-19: 10 mg via ORAL
  Filled 2012-11-18 (×3): qty 1

## 2012-11-18 MED ORDER — PHENYLEPHRINE HCL 10 MG/ML IJ SOLN
10.0000 mg | INTRAVENOUS | Status: DC | PRN
  Administered 2012-11-18: 120 ug/min via INTRAVENOUS

## 2012-11-18 MED ORDER — BISACODYL 5 MG PO TBEC
5.0000 mg | DELAYED_RELEASE_TABLET | Freq: Every day | ORAL | Status: DC | PRN
  Administered 2012-11-20: 5 mg via ORAL
  Filled 2012-11-18: qty 1

## 2012-11-18 MED ORDER — MIRTAZAPINE 15 MG PO TABS
15.0000 mg | ORAL_TABLET | Freq: Every day | ORAL | Status: DC
  Administered 2012-11-18 – 2012-11-19 (×2): 15 mg via ORAL
  Filled 2012-11-18 (×4): qty 1

## 2012-11-18 MED ORDER — CLOPIDOGREL BISULFATE 75 MG PO TABS
75.0000 mg | ORAL_TABLET | Freq: Every day | ORAL | Status: DC
  Administered 2012-11-18 – 2012-11-19 (×2): 75 mg via ORAL
  Filled 2012-11-18 (×4): qty 1

## 2012-11-18 MED ORDER — DOCUSATE SODIUM 100 MG PO CAPS
100.0000 mg | ORAL_CAPSULE | Freq: Two times a day (BID) | ORAL | Status: DC
  Administered 2012-11-20: 100 mg via ORAL
  Filled 2012-11-18 (×4): qty 1

## 2012-11-18 MED ORDER — ONDANSETRON HCL 4 MG/2ML IJ SOLN: 4.0000 mg | Freq: Once | INTRAMUSCULAR | Status: DC | PRN

## 2012-11-18 MED ORDER — ENOXAPARIN SODIUM 40 MG/0.4ML ~~LOC~~ SOLN
40.0000 mg | SUBCUTANEOUS | Status: DC
  Filled 2012-11-18: qty 0.4

## 2012-11-18 MED ORDER — CHLORHEXIDINE GLUCONATE 4 % EX LIQD
60.0000 mL | Freq: Once | CUTANEOUS | Status: DC
  Filled 2012-11-18: qty 60

## 2012-11-18 MED ORDER — FENTANYL CITRATE 0.05 MG/ML IJ SOLN
INTRAMUSCULAR | Status: DC | PRN
  Administered 2012-11-18 (×3): 50 ug via INTRAVENOUS

## 2012-11-18 MED ORDER — PROPOFOL 10 MG/ML IV BOLUS
INTRAVENOUS | Status: DC | PRN
  Administered 2012-11-18: 100 mg via INTRAVENOUS

## 2012-11-18 MED ORDER — MORPHINE SULFATE 2 MG/ML IJ SOLN: 2.0000 mg | INTRAMUSCULAR | Status: DC | PRN

## 2012-11-18 MED ORDER — MENTHOL 3 MG MT LOZG: 1.0000 | LOZENGE | OROMUCOSAL | Status: DC | PRN

## 2012-11-18 MED ORDER — BUDESONIDE-FORMOTEROL FUMARATE 160-4.5 MCG/ACT IN AERO
2.0000 | INHALATION_SPRAY | Freq: Two times a day (BID) | RESPIRATORY_TRACT | Status: DC
  Administered 2012-11-18 – 2012-11-20 (×4): 2 via RESPIRATORY_TRACT
  Filled 2012-11-18: qty 6

## 2012-11-18 MED ORDER — METOCLOPRAMIDE HCL 5 MG/ML IJ SOLN: 5.0000 mg | Freq: Three times a day (TID) | INTRAMUSCULAR | Status: DC | PRN

## 2012-11-18 MED ORDER — LIDOCAINE HCL (CARDIAC) 20 MG/ML IV SOLN
INTRAVENOUS | Status: DC | PRN
  Administered 2012-11-18: 60 mg via INTRAVENOUS

## 2012-11-18 MED ORDER — VANCOMYCIN HCL IN DEXTROSE 1-5 GM/200ML-% IV SOLN
1000.0000 mg | INTRAVENOUS | Status: AC
  Filled 2012-11-18: qty 200

## 2012-11-18 MED ORDER — POLYETHYLENE GLYCOL 3350 17 G PO PACK: 17.0000 g | PACK | Freq: Every day | ORAL | Status: DC | PRN

## 2012-11-18 SURGICAL SUPPLY — 37 items
BLADE SURG 15 STRL LF DISP TIS (BLADE) ×1 IMPLANT
BLADE SURG 15 STRL SS (BLADE) ×1
CLOTH BEACON ORANGE TIMEOUT ST (SAFETY) ×2 IMPLANT
COVER SURGICAL LIGHT HANDLE (MISCELLANEOUS) ×2 IMPLANT
DRAPE STERI IOBAN 125X83 (DRAPES) ×2 IMPLANT
DRSG ADAPTIC 3X8 NADH LF (GAUZE/BANDAGES/DRESSINGS) IMPLANT
DRSG MEPILEX BORDER 4X4 (GAUZE/BANDAGES/DRESSINGS) ×2 IMPLANT
DURAPREP 26ML APPLICATOR (WOUND CARE) ×2 IMPLANT
ELECT REM PT RETURN 9FT ADLT (ELECTROSURGICAL) ×2
ELECTRODE REM PT RTRN 9FT ADLT (ELECTROSURGICAL) ×1 IMPLANT
FACESHIELD LNG OPTICON STERILE (SAFETY) ×2 IMPLANT
GLOVE BIO SURGEON STRL SZ7.5 (GLOVE) ×2 IMPLANT
GLOVE BIO SURGEON STRL SZ8 (GLOVE) ×2 IMPLANT
GLOVE EUDERMIC 7 POWDERFREE (GLOVE) ×2 IMPLANT
GLOVE SS BIOGEL STRL SZ 7.5 (GLOVE) ×1 IMPLANT
GLOVE SUPERSENSE BIOGEL SZ 7.5 (GLOVE) ×1
GOWN STRL NON-REIN LRG LVL3 (GOWN DISPOSABLE) ×2 IMPLANT
GOWN STRL REIN XL XLG (GOWN DISPOSABLE) ×4 IMPLANT
KIT BASIN OR (CUSTOM PROCEDURE TRAY) ×2 IMPLANT
KIT ROOM TURNOVER OR (KITS) ×2 IMPLANT
MANIFOLD NEPTUNE II (INSTRUMENTS) ×2 IMPLANT
NS IRRIG 1000ML POUR BTL (IV SOLUTION) ×2 IMPLANT
PACK GENERAL/GYN (CUSTOM PROCEDURE TRAY) ×2 IMPLANT
PAD ARMBOARD 7.5X6 YLW CONV (MISCELLANEOUS) ×4 IMPLANT
SCREW CANN 32 THRD/80 7.3 (Screw) ×2 IMPLANT
SCREW CANN 32 THRD/90 7.3 (Screw) ×2 IMPLANT
SCREW CANN 32 THRD/95 7.3 (Screw) ×4 IMPLANT
STAPLER VISISTAT 35W (STAPLE) ×2 IMPLANT
STRIP CLOSURE SKIN 1/2X4 (GAUZE/BANDAGES/DRESSINGS) ×2 IMPLANT
SUT MNCRL AB 3-0 PS2 18 (SUTURE) ×2 IMPLANT
SUT VIC AB 0 CT1 27 (SUTURE) ×1
SUT VIC AB 0 CT1 27XBRD ANBCTR (SUTURE) ×1 IMPLANT
SUT VIC AB 2-0 CT1 27 (SUTURE) ×1
SUT VIC AB 2-0 CT1 TAPERPNT 27 (SUTURE) ×1 IMPLANT
TOWEL OR 17X24 6PK STRL BLUE (TOWEL DISPOSABLE) ×2 IMPLANT
TOWEL OR 17X26 10 PK STRL BLUE (TOWEL DISPOSABLE) ×2 IMPLANT
WATER STERILE IRR 1000ML POUR (IV SOLUTION) ×2 IMPLANT

## 2012-11-18 NOTE — Op Note (Signed)
11/18/2012  7:40 PM  PATIENT:   Carolyn [name]  72 y.o. female  PRE-OPERATIVE DIAGNOSIS:  Valgus, minimally impacted left femoral neck fracture  POST-OPERATIVE DIAGNOSIS:  same  PROCEDURE:  ORIF with cannulated screws  SURGEON:  Mory Herrman, Vania Rea M.D.  ASSISTANTS: none   ANESTHESIA:   GET  EBL: <100cc  SPECIMEN:  none  Drains: none   PATIENT DISPOSITION:  PACU - hemodynamically stable.    PLAN OF CARE: Admit to inpatient   Dictation# [id number]

## 2012-11-18 NOTE — Preoperative (Signed)
Beta Blockers   Reason not to administer Beta Blockers:Taken 11/18/2012 at 0800

## 2012-11-18 NOTE — ED Provider Notes (Signed)
CSN: [id number]     Arrival date & time 11/18/12  1255 History   First MD Initiated Contact with Patient 11/18/12 1258     Chief Complaint  Patient presents with  . Fall   (Consider location/radiation/quality/duration/timing/severity/associated sxs/prior Treatment) HPI Comments: Patient brought to the ER by ambulance from assisted living. Patient reports that she was getting out of her wheelchair and tripped over mobile, falling onto her left side. She was able to get up, but says that she has pain and "clicking" in the left hip with movement. Fall occurred approximately one hour ago. Pain is mild to is not standing. She did not hit her head, no loss of consciousness. Denies neck and back pain. There was no precursor or chest pain, shortness, palpitations. No concern for syncope.  Patient is a 72 y.o. female presenting with fall.  Fall    Past Medical History  Diagnosis Date  . Hypertension   . COPD (chronic obstructive pulmonary disease)   . Anemia   . Headaches, cluster   . Wears glasses   . CVA (cerebral vascular accident) 01/2010   Past Surgical History  Procedure Laterality Date  . Abdominal hysterectomy    . Varicose vein surgery    . Left hand    . Cholecystectomy  07/20/10    Dr Andrey Campanile; Lap chole with ioc   Family History  Problem Relation Age of Onset  . Heart disease Father   . Heart disease Mother   . Diabetes Father   . Hypertension Mother   . Cancer Maternal Aunt     ovarian, breast  . Lung disease Father    History  Substance Use Topics  . Smoking status: Former Smoker -- 1.00 packs/day for 30 years    Types: Cigarettes    Quit date: 02/03/2012  . Smokeless tobacco: Never Used  . Alcohol Use: No   OB History   Grav Para Term Preterm Abortions TAB SAB Ect Mult Living                 Review of Systems  Respiratory: Negative.   Cardiovascular: Negative.   Musculoskeletal: Positive for arthralgias. Negative for back pain and neck pain.  All other  systems reviewed and are negative.    Allergies  Penicillins and Codeine  Home Medications   Current Outpatient Rx  Name  Route  Sig  Dispense  Refill  . acetaminophen (TYLENOL) 325 MG tablet   Oral   Take 650 mg by mouth 3 (three) times daily as needed. For pain         . albuterol (PROVENTIL HFA;VENTOLIN HFA) 108 (90 BASE) MCG/ACT inhaler   Inhalation   Inhale 2 puffs into the lungs every 4 (four) hours as needed. For shortness of breath         . albuterol (PROVENTIL) (2.5 MG/3ML) 0.083% nebulizer solution   Nebulization   Take 2.5 mg by nebulization every 6 (six) hours as needed for wheezing or shortness of breath. For wheezing         . budesonide-formoterol (SYMBICORT) 160-4.5 MCG/ACT inhaler   Inhalation   Inhale 2 puffs into the lungs 2 (two) times daily.         . clopidogrel (PLAVIX) 75 MG tablet   Oral   Take 75 mg by mouth at bedtime.         . diazepam (VALIUM) 5 MG tablet   Oral   Take 5 mg by mouth every 8 (eight) hours as needed  for anxiety or sleep.         Marland Kitchen gabapentin (NEURONTIN) 300 MG capsule   Oral   Take 300 mg by mouth 2 (two) times daily. Takes at 8a and 2p         . gabapentin (NEURONTIN) 300 MG capsule   Oral   Take 600 mg by mouth at bedtime.         . Lactobacillus (ACIDOPHILUS EXTRA STRENGTH PO)   Oral   Take 1 tablet by mouth daily.         Marland Kitchen loperamide (IMODIUM A-D) 2 MG tablet   Oral   Take 2 mg by mouth 4 (four) times daily as needed. For diarrhea         . magnesium oxide (MAG-OX) 400 MG tablet   Oral   Take 400 mg by mouth 2 (two) times daily.         . metoprolol succinate (TOPROL-XL) 25 MG 24 hr tablet   Oral   Take 25 mg by mouth daily.         . mirtazapine (REMERON) 15 MG tablet   Oral   Take 15 mg by mouth at bedtime.         . ondansetron (ZOFRAN) 4 MG tablet   Oral   Take 4 mg by mouth every 6 (six) hours as needed. For nausea         . oxybutynin (DITROPAN-XL) 10 MG 24 hr  tablet   Oral   Take 10 mg by mouth daily.         . pravastatin (PRAVACHOL) 20 MG tablet   Oral   Take 20 mg by mouth every evening.          . promethazine (PHENERGAN) 25 MG tablet   Oral   Take 12.5 mg by mouth every 6 (six) hours as needed. For nausea          BP 135/76  Pulse 100  Temp(Src) 98.5 F (36.9 C) (Oral)  Resp 20  SpO2 95% Physical Exam  Constitutional: She is oriented to person, place, and time. She appears well-developed and well-nourished. No distress.  HENT:  Head: Normocephalic and atraumatic.  Right Ear: Hearing normal.  Left Ear: Hearing normal.  Nose: Nose normal.  Mouth/Throat: Oropharynx is clear and moist and mucous membranes are normal.  Eyes: Conjunctivae and EOM are normal. Pupils are equal, round, and reactive to light.  Neck: Normal range of motion. Neck supple.  Cardiovascular: Regular rhythm, S1 normal and S2 normal.  Exam reveals no gallop and no friction rub.   No murmur heard. Pulmonary/Chest: Effort normal and breath sounds normal. No respiratory distress. She exhibits no tenderness.  Abdominal: Soft. Normal appearance and bowel sounds are normal. There is no hepatosplenomegaly. There is no tenderness. There is no rebound, no guarding, no tenderness at McBurney's point and negative Murphy's sign. No hernia.  Musculoskeletal: Normal range of motion.       Left hip: She exhibits normal range of motion and no deformity.       Cervical back: Normal.       Thoracic back: Normal.       Lumbar back: Normal.  Able to flex and lift the left leg, but some pain with active and passive range of motion.  Neurological: She is alert and oriented to person, place, and time. She has normal strength. No cranial nerve deficit or sensory deficit. Coordination normal. GCS eye subscore is 4. GCS verbal subscore is  5. GCS motor subscore is 6.  Skin: Skin is warm, dry and intact. No rash noted. No cyanosis.  Psychiatric: She has a normal mood and affect.  Her speech is normal and behavior is normal. Thought content normal.    ED Course  Procedures (including critical care time) Labs Review Labs Reviewed - No data to display Imaging Review Dg Hip Complete Left  11/18/2012   CLINICAL DATA:  Fall  EXAM: LEFT HIP - COMPLETE 2+ VIEW  COMPARISON:  None.  FINDINGS: 3 views of the left hip submitted. There is mild displaced fracture of left femoral neck. Diffuse osteopenia is noted.  IMPRESSION: Mild displaced fracture of left femoral neck. Diffuse osteopenia.   Electronically Signed   By: Natasha Mead M.D.   On: 11/18/2012 14:02   Dg Chest Port 1 View  11/18/2012   CLINICAL DATA:  Preop  EXAM: PORTABLE CHEST - 1 VIEW  COMPARISON:  09/21/2012  FINDINGS: Cardiomediastinal silhouette is stable. Hyperinflation again noted No acute infiltrate or pleural effusion. No pulmonary edema.  IMPRESSION: No active disease. Hyperinflation again noted.   Electronically Signed   By: Natasha Mead M.D.   On: 11/18/2012 15:11    EKG Interpretation   None       MDM  Diagnosis: Left hip fracture  Patient presents to the ER for evaluation after fall. Patient had a mechanical fall when she tripped over a wheelchair. She landed on her left hip area. She does not have any evidence of head injury, did not lose consciousness. Back her clinically unremarkable and cleared. X-ray does show mild displaced fracture of the left femoral neck. Patient will be admitted by the hospitalist service, Doctor Vanessa Barbara. Doctor Supple, on-call for orthopedics has been consulted.    Gilda Crease, MD 11/18/12 [phone]

## 2012-11-18 NOTE — Consult Note (Signed)
Reason for Consult:left hip pain Referring Physician: Hospitalist  HPI: Carolyn [name] is an 72 y.o. female with a mechanical fall after tripping in her Assisted Living facility earlier today. Denies syncope or other injury. Medicine consulted [place] for management. Prior to injury she ambulated minimal distances with use of assistive devices.  Past Medical History  Diagnosis Date  . Hypertension   . COPD (chronic obstructive pulmonary disease)   . Anemia   . Headaches, cluster   . Wears glasses   . CVA (cerebral vascular accident) 01/2010    Past Surgical History  Procedure Laterality Date  . Abdominal hysterectomy    . Varicose vein surgery    . Left hand    . Cholecystectomy  07/20/10    Dr Andrey Campanile; Lap chole with ioc    Family History  Problem Relation Age of Onset  . Heart disease Father   . Heart disease Mother   . Diabetes Father   . Hypertension Mother   . Cancer Maternal Aunt     ovarian, breast  . Lung disease Father     Social History:  reports that she quit smoking about 9 months ago. Her smoking use included Cigarettes. She has a 30 pack-year smoking history. She has never used smokeless tobacco. She reports that she does not drink alcohol or use illicit drugs.  Allergies:  Allergies  Allergen Reactions  . Penicillins Other (See Comments)    Reaction unknown  . Codeine Rash    Reaction unknown    Medications:  Prior to Admission:  Prescriptions prior to admission  Medication Sig Dispense Refill  . acetaminophen (TYLENOL) 325 MG tablet Take 650 mg by mouth 3 (three) times daily as needed. For pain      . albuterol (PROVENTIL HFA;VENTOLIN HFA) 108 (90 BASE) MCG/ACT inhaler Inhale 2 puffs into the lungs every 4 (four) hours as needed. For shortness of breath      . albuterol (PROVENTIL) (2.5 MG/3ML) 0.083% nebulizer solution Take 2.5 mg by nebulization every 6 (six) hours as needed for wheezing or shortness of breath. For wheezing      . budesonide-formoterol  (SYMBICORT) 160-4.5 MCG/ACT inhaler Inhale 2 puffs into the lungs 2 (two) times daily.      . clopidogrel (PLAVIX) 75 MG tablet Take 75 mg by mouth at bedtime.      . diazepam (VALIUM) 5 MG tablet Take 5 mg by mouth every 8 (eight) hours as needed for anxiety or sleep.      Marland Kitchen gabapentin (NEURONTIN) 300 MG capsule Take 300 mg by mouth 2 (two) times daily. Takes at 8a and 2p      . gabapentin (NEURONTIN) 300 MG capsule Take 600 mg by mouth at bedtime.      . Lactobacillus (ACIDOPHILUS EXTRA STRENGTH PO) Take 1 tablet by mouth daily.      Marland Kitchen loperamide (IMODIUM A-D) 2 MG tablet Take 2 mg by mouth 4 (four) times daily as needed. For diarrhea      . magnesium oxide (MAG-OX) 400 MG tablet Take 400 mg by mouth 2 (two) times daily.      . metoprolol succinate (TOPROL-XL) 25 MG 24 hr tablet Take 25 mg by mouth daily.      . mirtazapine (REMERON) 15 MG tablet Take 15 mg by mouth at bedtime.      . ondansetron (ZOFRAN) 4 MG tablet Take 4 mg by mouth every 6 (six) hours as needed. For nausea      . oxybutynin (  DITROPAN-XL) 10 MG 24 hr tablet Take 10 mg by mouth daily.      . pravastatin (PRAVACHOL) 20 MG tablet Take 20 mg by mouth every evening.       . promethazine (PHENERGAN) 25 MG tablet Take 12.5 mg by mouth every 6 (six) hours as needed. For nausea        Results for orders placed during the hospital encounter of 11/18/12 (from the past 48 hour(s))  CBC WITH DIFFERENTIAL     Status: Abnormal   Collection Time    11/18/12  3:30 PM      Result Value Range   WBC 9.4  4.0 - 10.5 K/uL   RBC 3.85 (*) 3.87 - 5.11 MIL/uL   Hemoglobin 12.2  12.0 - 15.0 g/dL   HCT 08.6  57.8 - 46.9 %   MCV 100.3 (*) 78.0 - 100.0 fL   MCH 31.7  26.0 - 34.0 pg   MCHC 31.6  30.0 - 36.0 g/dL   RDW 62.9  52.8 - 41.3 %   Platelets 271  150 - 400 K/uL   Neutrophils Relative % 77  43 - 77 %   Neutro Abs 7.3  1.7 - 7.7 K/uL   Lymphocytes Relative 16  12 - 46 %   Lymphs Abs 1.5  0.7 - 4.0 K/uL   Monocytes Relative 5  3 - 12 %    Monocytes Absolute 0.5  0.1 - 1.0 K/uL   Eosinophils Relative 3  0 - 5 %   Eosinophils Absolute 0.2  0.0 - 0.7 K/uL   Basophils Relative 0  0 - 1 %   Basophils Absolute 0.0  0.0 - 0.1 K/uL  BASIC METABOLIC PANEL     Status: Abnormal   Collection Time    11/18/12  3:30 PM      Result Value Range   Sodium 143  135 - 145 mEq/L   Potassium 4.0  3.5 - 5.1 mEq/L   Chloride 102  96 - 112 mEq/L   CO2 36 (*) 19 - 32 mEq/L   Glucose, Bld 81  70 - 99 mg/dL   BUN 16  6 - 23 mg/dL   Creatinine, Ser 2.44  0.50 - 1.10 mg/dL   Calcium 01.0  8.4 - 27.2 mg/dL   GFR calc non Af Amer 68 (*) >90 mL/min   GFR calc Af Amer 79 (*) >90 mL/min   Comment: (NOTE)     The eGFR has been calculated using the CKD EPI equation.     This calculation has not been validated in all clinical situations.     eGFR's persistently <90 mL/min signify possible Chronic Kidney     Disease.  POCT I-STAT TROPONIN I     Status: None   Collection Time    11/18/12  3:43 PM      Result Value Range   Troponin i, poc 0.01  0.00 - 0.08 ng/mL   Comment 3            Comment: Due to the release kinetics of cTnI,     a negative result within the first hours     of the onset of symptoms does not rule out     myocardial infarction with certainty.     If myocardial infarction is still suspected,     repeat the test at appropriate intervals.    Dg Hip Complete Left  11/18/2012   CLINICAL DATA:  Fall  EXAM: LEFT HIP - COMPLETE 2+  VIEW  COMPARISON:  None.  FINDINGS: 3 views of the left hip submitted. There is mild displaced fracture of left femoral neck. Diffuse osteopenia is noted.  IMPRESSION: Mild displaced fracture of left femoral neck. Diffuse osteopenia.   Electronically Signed   By: Natasha Mead M.D.   On: 11/18/2012 14:02   Dg Chest Port 1 View  11/18/2012   CLINICAL DATA:  Preop  EXAM: PORTABLE CHEST - 1 VIEW  COMPARISON:  09/21/2012  FINDINGS: Cardiomediastinal silhouette is stable. Hyperinflation again noted No acute  infiltrate or pleural effusion. No pulmonary edema.  IMPRESSION: No active disease. Hyperinflation again noted.   Electronically Signed   By: Natasha Mead M.D.   On: 11/18/2012 15:11    ROS: as in H&P  Physical Exam: Alert and oriented Left hip with pain on attempts at ROM. NVI  Vitals Temp:  [98.5 F (36.9 C)-98.8 F (37.1 C)] 98.8 F (37.1 C) (11/09 1607) Pulse Rate:  [100] 100 (11/09 1303) Resp:  [20-22] 22 (11/09 1607) BP: (104-135)/(75-82) 104/75 mmHg (11/09 1607) SpO2:  [91 %-95 %] 91 % (11/09 1607) There is no weight on file to calculate BMI.  Assessment/Plan: Impression: Minimally displaced impacted left femoral neck fracture Treatment: to OR for Left hip pinning tonight  Myan Locatelli for Dr. Francena Hanly 11/18/2012, 5:49 PM

## 2012-11-18 NOTE — ED Notes (Signed)
Patient transported to X-ray 

## 2012-11-18 NOTE — H&P (Addendum)
[facility] History and Physical  Carolyn [name] AOZ:[id number] DOB: 1940-03-15 DOA: 11/18/2012  Referring physician:  PCP: Eartha Inch, MD  Specialists:   Chief Complaint: Status post fall  HPI: Carolyn [name] is a 72 y.o. female with a past medical history of chronic objective pulmonary disease, chronic hypoxemic respiratory failure, requiring 2 L supplemental oxygen at baseline, history of CVA with residual left-sided weakness in 2012, presently a resident at an assisted facility. She reports being in her usual state health, this afternoon while standing up from her wheelchair, walked around and got tangled up with her oxygen line. Patient tripped and fell on her left side. She was unable to get up for which she called the nursing staff. She was on the floor, [facility] though continued to have severe left hip pain. Patient was brought to the emergent apartment at [facility] today where initial imaging studies showing  a mild displaced fracture of left femoral neck. Dr. Rennis Chris of orthopedic surgery was consulted from the emergency department. She denies symptoms prior to the fall. There were no reports of chest pain, worsening shortness of breath, palpitations, dizziness, or worsening focal neurological deficits. Patient at baseline had poor tolerance to physical exertion with her history of advanced COPD and chronic hypoxemic respiratory failure. She reports being able to walk short distances independently. She presently denies chest pain shortness of breath fevers chills nausea vomiting bloody stools dysuria hematuria.                                                                          Review of Systems: The patient denies anorexia, fever, weight loss,, vision loss, decreased hearing, hoarseness, chest pain, syncope, peripheral edema, balance deficits, hemoptysis, abdominal pain, melena, hematochezia, severe indigestion/heartburn, hematuria, incontinence, genital sores, muscle  weakness, suspicious skin lesions, transient blindness, difficulty walking, depression, unusual weight change, abnormal bleeding, enlarged lymph nodes, angioedema, and breast masses.    Past Medical History  Diagnosis Date  . Hypertension   . COPD (chronic obstructive pulmonary disease)   . Anemia   . Headaches, cluster   . Wears glasses   . CVA (cerebral vascular accident) 01/2010   Past Surgical History  Procedure Laterality Date  . Abdominal hysterectomy    . Varicose vein surgery    . Left hand    . Cholecystectomy  07/20/10    Dr Andrey Campanile; Lap chole with ioc   Social History:  reports that she quit smoking about 9 months ago. Her smoking use included Cigarettes. She has a 30 pack-year smoking history. She has never used smokeless tobacco. She reports that she does not drink alcohol or use illicit drugs. Patient is currently a resident at assisted living facility.  Allergies  Allergen Reactions  . Penicillins Other (See Comments)    Reaction unknown  . Codeine Rash    Reaction unknown    Family History  Problem Relation Age of Onset  . Heart disease Father   . Heart disease Mother   . Diabetes Father   . Hypertension Mother   . Cancer Maternal Aunt     ovarian, breast  . Lung disease Father     Prior to Admission medications   Medication Sig Start Date End  Date Taking? Authorizing Provider  acetaminophen (TYLENOL) 325 MG tablet Take 650 mg by mouth 3 (three) times daily as needed. For pain   Yes Historical Provider, MD  albuterol (PROVENTIL HFA;VENTOLIN HFA) 108 (90 BASE) MCG/ACT inhaler Inhale 2 puffs into the lungs every 4 (four) hours as needed. For shortness of breath   Yes Historical Provider, MD  albuterol (PROVENTIL) (2.5 MG/3ML) 0.083% nebulizer solution Take 2.5 mg by nebulization every 6 (six) hours as needed for wheezing or shortness of breath. For wheezing 01/14/12  Yes Ripudeep Jenna Luo, MD  budesonide-formoterol (SYMBICORT) 160-4.5 MCG/ACT inhaler Inhale 2  puffs into the lungs 2 (two) times daily.   Yes Historical Provider, MD  clopidogrel (PLAVIX) 75 MG tablet Take 75 mg by mouth at bedtime.   Yes Historical Provider, MD  diazepam (VALIUM) 5 MG tablet Take 5 mg by mouth every 8 (eight) hours as needed for anxiety or sleep.   Yes Historical Provider, MD  gabapentin (NEURONTIN) 300 MG capsule Take 300 mg by mouth 2 (two) times daily. Takes at 8a and 2p   Yes Historical Provider, MD  gabapentin (NEURONTIN) 300 MG capsule Take 600 mg by mouth at bedtime.   Yes Historical Provider, MD  Lactobacillus (ACIDOPHILUS EXTRA STRENGTH PO) Take 1 tablet by mouth daily.   Yes Historical Provider, MD  loperamide (IMODIUM A-D) 2 MG tablet Take 2 mg by mouth 4 (four) times daily as needed. For diarrhea   Yes Historical Provider, MD  magnesium oxide (MAG-OX) 400 MG tablet Take 400 mg by mouth 2 (two) times daily.   Yes Historical Provider, MD  metoprolol succinate (TOPROL-XL) 25 MG 24 hr tablet Take 25 mg by mouth daily.   Yes Historical Provider, MD  mirtazapine (REMERON) 15 MG tablet Take 15 mg by mouth at bedtime.   Yes Historical Provider, MD  ondansetron (ZOFRAN) 4 MG tablet Take 4 mg by mouth every 6 (six) hours as needed. For nausea   Yes Historical Provider, MD  oxybutynin (DITROPAN-XL) 10 MG 24 hr tablet Take 10 mg by mouth daily.   Yes Historical Provider, MD  pravastatin (PRAVACHOL) 20 MG tablet Take 20 mg by mouth every evening.    Yes Historical Provider, MD  promethazine (PHENERGAN) 25 MG tablet Take 12.5 mg by mouth every 6 (six) hours as needed. For nausea   Yes Historical Provider, MD   Physical Exam: Filed Vitals:   11/18/12 1500  BP: 133/82  Pulse:   Temp:   Resp:      General:  Patient is in no acute distress, she is awake alert and oriented x3  Eyes: Pupils are equal round reactive to light extraocular movement is  Neck: Neck supple symmetrical no jugular venous distention or carotid bruits  Cardiovascular: Regular rate and rhythm  normal S1-S2 no murmurs or gallops  Respiratory: Patient having diminished breath sounds bilaterally, a few expiratory wheezes, on supplemental oxygen currently in no acute respiratory distress  Abdomen: Soft nontender nondistended positive bowels  Skin: No rashes or lesions  Musculoskeletal: No edema, there is some pain with passive and active movement of her left lower extremities  Psychiatric: She is awake alert and oriented x3  Neurologic: 3 of 5 muscle strength to her left upper extremity, for femoral stretch left lower extremity, patient reporting she is at her baseline deficit, 5 of 5 muscle 2 right upper right lower extremities. No slurred speech  Labs on Admission:  Basic Metabolic Panel: No results found for this basename: NA, K, CL,  CO2, GLUCOSE, BUN, CREATININE, CALCIUM, MG, PHOS,  in the last 168 hours Liver Function Tests: No results found for this basename: AST, ALT, ALKPHOS, BILITOT, PROT, ALBUMIN,  in the last 168 hours No results found for this basename: LIPASE, AMYLASE,  in the last 168 hours No results found for this basename: AMMONIA,  in the last 168 hours CBC: No results found for this basename: WBC, NEUTROABS, HGB, HCT, MCV, PLT,  in the last 168 hours Cardiac Enzymes: No results found for this basename: CKTOTAL, CKMB, CKMBINDEX, TROPONINI,  in the last 168 hours  BNP (last 3 results) No results found for this basename: PROBNP,  in the last 8760 hours CBG: No results found for this basename: GLUCAP,  in the last 168 hours  Radiological Exams on Admission: Dg Hip Complete Left  11/18/2012   CLINICAL DATA:  Fall  EXAM: LEFT HIP - COMPLETE 2+ VIEW  COMPARISON:  None.  FINDINGS: 3 views of the left hip submitted. There is mild displaced fracture of left femoral neck. Diffuse osteopenia is noted.  IMPRESSION: Mild displaced fracture of left femoral neck. Diffuse osteopenia.   Electronically Signed   By: Natasha Mead M.D.   On: 11/18/2012 14:02   Dg Chest Port 1  View  11/18/2012   CLINICAL DATA:  Preop  EXAM: PORTABLE CHEST - 1 VIEW  COMPARISON:  09/21/2012  FINDINGS: Cardiomediastinal silhouette is stable. Hyperinflation again noted No acute infiltrate or pleural effusion. No pulmonary edema.  IMPRESSION: No active disease. Hyperinflation again noted.   Electronically Signed   By: Natasha Mead M.D.   On: 11/18/2012 15:11   Assessment/Plan Active Problems:   Left displaced femoral neck fracture   Fall   COPD Gold D , frequent exacerbations   H/O: CVA (cerebrovascular accident)   Hypertension   Chronic diastolic heart failure, NYHA class 1   Chronic respiratory failure   1. Left femoral neck fracture. Status post mechanical fall at her assistant living facility. She does not appear to have active cardiac pulmonary issues at this time, however, lab work is pending at the time of this dictation. Prior to surgical intervention would recommend following up on EKG, CBC, basic metabolic panel, and troponin. Given multiple comorbidities as well as advanced COPD and chronic hypoxemic respiratory failure, she would be at least in an intermediate surgical risk candidate. She denies symptoms prior to her fall as it appears that she tripped over her oxygen cord. 2. Chronic obstructive pulmonary disease, advanced. Currently compensated, but her baseline oxygen requirement. Will provide when necessary duo nebs. Chest x-ray did not show acute pulmonary disease. Findings consistent with COPD. 3. Status post fall. Likely secondary to mechanical fall which resulted in patient tripping over oxygen cord. She also has a history of stroke with residual left-sided weakness as well as been on multiple psychotropic medications which increase her risk of sustaining falls. For now will attempt to minimize polypharmacy. 4. History of cerebrovascular accident. Patient having a CVA in 2012 which resulted in residual left-sided weakness. Will continue Plavix any 5 mg by mouth  daily. 5. Hypertension. Blood pressure stable with her last blood pressure 133/82. Will continue metoprolol 25 mg by mouth daily 6. Nutrition. Will keep patient n.p.o     Code Status: Full Code Family Communication: Plan discussed with sister present at bedside Disposition Plan: Will admit pt to med/surg, anticipate will require greater than 2 nights hospitalization  Time spent: 70 minutes  Jeralyn Bennett [facility] Pager [phone]  If 7PM-7AM,  please contact night-coverage [email or web address] Password [facility] 11/18/2012, 3:33 PM

## 2012-11-18 NOTE — ED Notes (Signed)
Pt arrived by [facility] from [facility]. Pt was getting out of wheelchair, tripped over the wheel and fell, having left thigh pain, no deformity noted.

## 2012-11-18 NOTE — ED Notes (Signed)
Dr Vanessa Barbara at bedside to eval pt.

## 2012-11-18 NOTE — Anesthesia Procedure Notes (Signed)
Procedure Name: Intubation Date/Time: 11/18/2012 6:42 PM Performed by: Alanda Amass A Pre-anesthesia Checklist: Patient identified, Timeout performed, Emergency Drugs available, Suction available and Patient being monitored Patient Re-evaluated:Patient Re-evaluated prior to inductionOxygen Delivery Method: Circle system utilized Preoxygenation: Pre-oxygenation with 100% oxygen Intubation Type: IV induction, Rapid sequence and Cricoid Pressure applied Laryngoscope Size: Mac and 3 Grade View: Grade I Tube type: Oral Tube size: 7.5 mm Number of attempts: 1 Airway Equipment and Method: Stylet Secured at: 20 cm Tube secured with: Tape Dental Injury: Teeth and Oropharynx as per pre-operative assessment

## 2012-11-18 NOTE — Addendum Note (Signed)
Addendum created 11/18/12 2045 by Atilano Ina, CRNA   Modules edited: Anesthesia Events

## 2012-11-18 NOTE — Progress Notes (Signed)
Orthopedic Tech Progress Note Patient Details:  Carolyn [name] [birth year] [id number]  Patient ID: Carolyn [name], female   DOB: 04-Jul-1940, 72 y.o.   MRN: [id number] Trapeze bar patient helper  Nikki Dom 11/18/2012, 8:31 PM

## 2012-11-18 NOTE — Transfer of Care (Signed)
Immediate Anesthesia Transfer of Care Note  Patient: Carolyn [name]  Procedure(s) Performed: Procedure(s): CANNULATED HIP PINNING (Left)  Patient Location: PACU  Anesthesia Type:General  Level of Consciousness: awake  Airway & Oxygen Therapy: Patient Spontanous Breathing and Patient connected to nasal cannula oxygen  Post-op Assessment: Report given to PACU RN and Post -op Vital signs reviewed and stable  Post vital signs: Reviewed and stable  Complications: No apparent anesthesia complications

## 2012-11-18 NOTE — Anesthesia Preprocedure Evaluation (Addendum)
Anesthesia Evaluation  Patient identified by MRN, date of birth, ID band Patient awake    Reviewed: Allergy & Precautions, H&P , NPO status , Patient's Chart, lab work & pertinent test results, reviewed documented beta blocker date and time   Airway Mallampati: II TM Distance: >3 FB Neck ROM: full    Dental  (+) Edentulous Lower, Edentulous Upper and Dental Advisory Given   Pulmonary COPD oxygen dependent, former smoker,          Cardiovascular hypertension, Pt. on medications and Pt. on home beta blockers     Neuro/Psych  Headaches, Lt leg weakness CVA, Residual Symptoms    GI/Hepatic   Endo/Other    Renal/GU      Musculoskeletal   Abdominal   Peds  Hematology   Anesthesia Other Findings   Reproductive/Obstetrics                          Anesthesia Physical Anesthesia Plan  ASA: III  Anesthesia Plan: General   Post-op Pain Management:    Induction: Intravenous  Airway Management Planned: Oral ETT  Additional Equipment:   Intra-op Plan:   Post-operative Plan: Extubation in OR and Possible Post-op intubation/ventilation  Informed Consent: I have reviewed the patients History and Physical, chart, labs and discussed the procedure including the risks, benefits and alternatives for the proposed anesthesia with the patient or authorized representative who has indicated his/her understanding and acceptance.     Plan Discussed with: CRNA, Anesthesiologist and Surgeon  Anesthesia Plan Comments:         Anesthesia Quick Evaluation

## 2012-11-18 NOTE — Anesthesia Postprocedure Evaluation (Signed)
Anesthesia Post Note  Patient: [name]  Procedure(s) Performed: Procedure(s) (LRB): CANNULATED HIP PINNING (Left)  Anesthesia type: General  Patient location: PACU  Post pain: Pain level controlled and Adequate analgesia  Post assessment: Post-op Vital signs reviewed, Patient's Cardiovascular Status Stable, Respiratory Function Stable, Patent Airway and Pain level controlled  Last Vitals:  Filed Vitals:   11/18/12 2030  BP: 108/51  Pulse: 118  Temp:   Resp: 16    Post vital signs: Reviewed and stable  Level of consciousness: awake, alert  and oriented  Complications: No apparent anesthesia complications

## 2012-11-19 DIAGNOSIS — I10 Essential (primary) hypertension: Secondary | ICD-10-CM

## 2012-11-19 DIAGNOSIS — W19XXXA Unspecified fall, initial encounter: Secondary | ICD-10-CM

## 2012-11-19 DIAGNOSIS — Z8673 Personal history of transient ischemic attack (TIA), and cerebral infarction without residual deficits: Secondary | ICD-10-CM

## 2012-11-19 LAB — CBC
Hemoglobin: 10 g/dL — ABNORMAL LOW (ref 12.0–15.0)
MCH: 32.1 pg (ref 26.0–34.0)
MCV: 101.3 fL — ABNORMAL HIGH (ref 78.0–100.0)
Platelets: 217 10*3/uL (ref 150–400)
RBC: 3.12 MIL/uL — ABNORMAL LOW (ref 3.87–5.11)
RDW: 13.2 % (ref 11.5–15.5)
WBC: 6.9 10*3/uL (ref 4.0–10.5)

## 2012-11-19 LAB — URINE CULTURE
Colony Count: NO GROWTH
Culture: NO GROWTH

## 2012-11-19 LAB — BASIC METABOLIC PANEL
CO2: 35 mEq/L — ABNORMAL HIGH (ref 19–32)
Calcium: 9.1 mg/dL (ref 8.4–10.5)
GFR calc Af Amer: 81 mL/min — ABNORMAL LOW (ref >90)
GFR calc non Af Amer: 70 mL/min — ABNORMAL LOW (ref >90)
Glucose, Bld: 98 mg/dL (ref 70–99)
Potassium: 4.2 mEq/L (ref 3.5–5.1)
Sodium: 142 mEq/L (ref 135–145)

## 2012-11-19 NOTE — Evaluation (Signed)
Physical Therapy Evaluation Patient Details Name: Carolyn [name] MRN: [id number] DOB: [birth year] Today's Date: 11/19/2012 Time: 1191-4782 PT Time Calculation (min): 23 min  PT Assessment / Plan / Recommendation History of Present Illness  Carolyn [name] is a 72 y.o. female with a past medical history of chronic objective pulmonary disease, chronic hypoxemic respiratory failure, requiring 2 L supplemental oxygen at baseline, history of CVA with residual left-sided weakness in 2012, presently a resident at an assisted facility. She reports being in her usual state health, this afternoon while standing up from her wheelchair, walked around and got tangled up with her oxygen line. Patient tripped and fell on her left side. She was unable to get up for which she called the nursing staff. She was on the floor, [facility] though continued to have severe left hip pain. Patient was brought to the emergent apartment at [facility] today where initial imaging studies showing  a mild displaced fracture of left femoral neck. S/P LLE ORIF  Clinical Impression  This patient presents with acute pain and decreased functional independence following the above mentioned procedure. At the time of PT eval, pt required min assist for transfers and ambulation. Pt is from an ALF, and states she has ample assistance there for transfers and ADL's when needed. If pt can receive continued skilled services as HHPT at the ALF, then d/c back to ALF is appropriate at this time. This patient is appropriate for skilled PT interventions to address functional limitations, improve safety and independence with functional mobility, and return to PLOF.     PT Assessment  Patient needs continued PT services    Follow Up Recommendations  Home health PT (At assisted living facility)    Does the patient have the potential to tolerate intense rehabilitation      Barriers to Discharge        Equipment Recommendations  3in1 (PT)     Recommendations for Other Services     Frequency Min 5X/week    Precautions / Restrictions Precautions Precautions: Fall Restrictions Weight Bearing Restrictions: Yes LLE Weight Bearing: Partial weight bearing LLE Partial Weight Bearing Percentage or Pounds: 50% Other Position/Activity Restrictions: 50%   Pertinent Vitals/Pain Pt reports 5/10 at rest      Mobility  Bed Mobility Bed Mobility: Not assessed Details for Bed Mobility Assistance: Pt received sitting EOB with nursing present Transfers Transfers: Sit to Stand;Stand to Sit Sit to Stand: 4: Min assist;With upper extremity assist;From bed Stand to Sit: 4: Min assist;To chair/3-in-1;With upper extremity assist Details for Transfer Assistance: VC's for hand placement on seated surface. Pt showed good control descending into chair. Ambulation/Gait Ambulation/Gait Assistance: 4: Min assist Ambulation Distance (Feet): 15 Feet Assistive device: Rolling walker Ambulation/Gait Assistance Details: Pt was able to ambulate with VC's for sequencing and safety awareness with the RW and min assist for walker placement. Pt likes to tell stories and talk, and needs some cueing to stay on task for safety. Gait velocity: Decreased Stairs: No    Exercises     PT Diagnosis: Difficulty walking;Acute pain  PT Problem List: Decreased strength;Decreased range of motion;Decreased activity tolerance;Decreased balance;Decreased mobility;Decreased knowledge of use of DME;Decreased safety awareness;Pain;Decreased knowledge of precautions PT Treatment Interventions: DME instruction;Gait training;Stair training;Functional mobility training;Therapeutic activities;Therapeutic exercise;Neuromuscular re-education;Patient/family education     PT Goals(Current goals can be found in the care plan section) Acute Rehab PT Goals Patient Stated Goal: To be able to go back to ALF PT Goal Formulation: With patient Time For Goal Achievement:  11/26/12 Potential to Achieve Goals: Good  Visit Information  Last PT Received On: 11/19/12 Assistance Needed: +2 (Chair follow) History of Present Illness: Carolyn [name] is a 72 y.o. female with a past medical history of chronic objective pulmonary disease, chronic hypoxemic respiratory failure, requiring 2 L supplemental oxygen at baseline, history of CVA with residual left-sided weakness in 2012, presently a resident at an assisted facility. She reports being in her usual state health, this afternoon while standing up from her wheelchair, walked around and got tangled up with her oxygen line. Patient tripped and fell on her left side. She was unable to get up for which she called the nursing staff. She was on the floor, [facility] though continued to have severe left hip pain. Patient was brought to the emergent apartment at [facility] today where initial imaging studies showing  a mild displaced fracture of left femoral neck. S/P LLE ORIF       Prior Functioning  Home Living Family/patient expects to be discharged to:: Assisted living Home Equipment: Krystal Clark - 2 wheels;Wheelchair - manual Additional Comments: 2L/min supplemental O2 at rest, 3L/min with therapy at baseline. Prior Function Level of Independence: Independent with assistive device(s) Comments: pushes wheelchair to amb so she can put her O2 in it  Communication Communication: No difficulties Dominant Hand: Right    Cognition  Cognition Arousal/Alertness: Awake/alert Behavior During Therapy: WFL for tasks assessed/performed Overall Cognitive Status: Within Functional Limits for tasks assessed    Extremity/Trunk Assessment Upper Extremity Assessment Upper Extremity Assessment: Defer to OT evaluation Lower Extremity Assessment Lower Extremity Assessment: LLE deficits/detail LLE Deficits / Details: Decreased strength and AROM consistent with ORIF of femur fracture Cervical / Trunk Assessment Cervical / Trunk  Assessment: Kyphotic   Balance Balance Balance Assessed: Yes Static Sitting Balance Static Sitting - Balance Support: Feet supported;Bilateral upper extremity supported Static Sitting - Level of Assistance: 5: Stand by assistance Static Sitting - Comment/# of Minutes: 8 minutes to get equipment ready and take history Static Standing Balance Static Standing - Balance Support: Bilateral upper extremity supported Static Standing - Level of Assistance: 5: Stand by assistance Static Standing - Comment/# of Minutes: 30 seconds prior to initiation of gait training  End of Session PT - End of Session Equipment Utilized During Treatment: Gait belt Activity Tolerance: Patient tolerated treatment well Patient left: in chair;with call bell/phone within reach Nurse Communication: Mobility status  GP     Ruthann Cancer 11/19/2012, 12:04 PM  Ruthann Cancer, PT, DPT [phone]

## 2012-11-19 NOTE — Progress Notes (Signed)
[facility] PROGRESS NOTE  ANNAHI SHORT NWG:[id number] DOB: [birth year] DOA: 11/18/2012 PCP: Eartha Inch, MD  Assessment/Plan: 1-Left femoral neck fracture. Status post mechanical fall at her assistant living facility.  -status post cannulated hip pinning -follow PT rec's -minimize narcotics -d/c foley -follow CBC in am  2-Chronic diastolic heart failure: compensated -strict intake and output -heart healthy diet  3-Chronic resp failure: due to obstructive pulmonary disease, advanced. Currently compensated, use oxygen at baseline. Will provide when necessary duo nebs.  -Chest x-ray did not show acute pulmonary disease; just chronic findings and hyperventilation consistent with COPD.   4-Status post fall. Mechanical in nature. -minimize narcotics and follow PT rec's -patient at high risk for fall at baseline   5-History of cerebrovascular accident: Patient having a CVA in 2012 which resulted in residual left-sided weakness. Will continue Plavix   6-Hypertension. Blood pressure stable with her last blood pressure 133/82. Will continue metoprolol 25 mg by mouth daily   Code Status: Full Family Communication: sister at bedside Disposition Plan: to be determine   Consultants:  Orthopedic surgery  Procedures:  Left hip cannulated pinning  Antibiotics:  None   HPI/Subjective: Complaining of some pain on her left hip but well tolerated; no fever, no SOB, no CP  Objective: Filed Vitals:   11/19/12 1345  BP: 121/66  Pulse: 95  Temp: 99 F (37.2 C)  Resp: 18    Intake/Output Summary (Last 24 hours) at 11/19/12 2117 Last data filed at 11/19/12 1700  Gross per 24 hour  Intake 1631.25 ml  Output   1552 ml  Net  79.25 ml   Exam:   General:  Pain on her left hip with movement, well controlled with pain meds  Cardiovascular: S1 and S2, no rubs, no gallops  Respiratory: CTA bilaterally  Abdomen: soft, NT, ND, positive BS  Musculoskeletal: bruises on  left thigh, no swelling, dressing in place; no cyanosis.  Data Reviewed: Basic Metabolic Panel:  Recent Labs Lab 11/18/12 1530 11/19/12 0553  NA 143 142  K 4.0 4.2  CL 102 103  CO2 36* 35*  GLUCOSE 81 98  BUN 16 10  CREATININE 0.85 0.83  CALCIUM 10.1 9.1   CBC:  Recent Labs Lab 11/18/12 1530 11/19/12 0553  WBC 9.4 6.9  NEUTROABS 7.3  --   HGB 12.2 10.0*  HCT 38.6 31.6*  MCV 100.3* 101.3*  PLT 271 217    Recent Results (from the past 240 hour(s))  MRSA PCR SCREENING     Status: None   Collection Time    11/18/12  5:48 PM      Result Value Range Status   MRSA by PCR NEGATIVE  NEGATIVE Final   Comment:            The GeneXpert MRSA Assay (FDA     approved for NASAL specimens     only), is one component of a     comprehensive MRSA colonization     surveillance program. It is not     intended to diagnose MRSA     infection nor to guide or     monitor treatment for     MRSA infections.     Studies: Dg Hip Complete Left  11/18/2012   CLINICAL DATA:  Fall  EXAM: LEFT HIP - COMPLETE 2+ VIEW  COMPARISON:  None.  FINDINGS: 3 views of the left hip submitted. There is mild displaced fracture of left femoral neck. Diffuse osteopenia is noted.  IMPRESSION: Mild displaced fracture  of left femoral neck. Diffuse osteopenia.   Electronically Signed   By: Natasha Mead M.D.   On: 11/18/2012 14:02   Dg Hip Operative Left  11/19/2012   CLINICAL DATA:  Left femoral neck fracture.  EXAM: OPERATIVE LEFT HIP  COMPARISON:  Earlier on the same date.  FINDINGS: Two C arm views of the left hip demonstrate interval placement of 4 screws bridging the previously demonstrated femoral neck fracture. Anatomic position and alignment on these views.  IMPRESSION: Screw fixation of the previously demonstrated left femoral neck fracture   Electronically Signed   By: Gordan Payment M.D.   On: 11/19/2012 02:06   Dg Chest Port 1 View  11/18/2012   CLINICAL DATA:  Preop  EXAM: PORTABLE CHEST - 1 VIEW   COMPARISON:  09/21/2012  FINDINGS: Cardiomediastinal silhouette is stable. Hyperinflation again noted No acute infiltrate or pleural effusion. No pulmonary edema.  IMPRESSION: No active disease. Hyperinflation again noted.   Electronically Signed   By: Natasha Mead M.D.   On: 11/18/2012 15:11    Scheduled Meds: . budesonide-formoterol  2 puff Inhalation BID  . clopidogrel  75 mg Oral QHS  . docusate sodium  100 mg Oral BID  . enoxaparin (LOVENOX) injection  30 mg Subcutaneous Q24H  . gabapentin  300 mg Oral Custom  . metoprolol succinate  25 mg Oral Daily  . mirtazapine  15 mg Oral QHS  . nicotine  21 mg Transdermal Daily  . oxybutynin  10 mg Oral Daily  . simvastatin  10 mg Oral q1800   Continuous Infusions: . lactated ringers 75 mL/hr at 11/19/12 0700    Active Problems:   COPD Gold D , frequent exacerbations   H/O: CVA (cerebrovascular accident)   Hypertension   Chronic diastolic heart failure, NYHA class 1   Chronic respiratory failure   Left displaced femoral neck fracture   Fall    Time spent: >30 minutes    Charlye Spare  [facility] Pager [phone]. If 7PM-7AM, please contact night-coverage at [email or web address], password [facility] 11/19/2012, 9:17 PM  LOS: 1 day

## 2012-11-19 NOTE — Op Note (Signed)
NAMEKELIYAH, SPILLMAN                 ACCOUNT NO.:  [id number]  MEDICAL RECORD NO.:  [id number]  LOCATION:  5N03C                        FACILITY:  [facility]  PHYSICIAN:  Vania Rea. Jakarie Pember, M.D.  DATE OF BIRTH:  [birth year]  DATE OF PROCEDURE:  11/18/2012 DATE OF DISCHARGE:                              OPERATIVE REPORT   PREOPERATIVE DIAGNOSIS:  Valgus minimally impacted left femoral neck fracture.  POSTOPERATIVE DIAGNOSIS:  Valgus minimally impacted left femoral neck fracture.  PROCEDURE:  Open reduction and internal fixation of multiple cannulated screws.  SURGEON:  Vania Rea. Jennamarie Goings, M.D.  ASSISTANT:  None.  ANESTHESIA:  General endotracheal.  ESTIMATED BLOOD LOSS:  Less than 100 mL.  DRAINS:  None.  HISTORY:  Ms. Graddy is a 72 year old female, who fell earlier today sustaining a valgus impacted left femoral neck fracture.  She is brought to the operating room at this time for planned internal fixation with multiple cannulated screws.  Preoperatively, I counseled Ms. Wiegman regarding treatment options and risks versus benefits thereof.  Possible surgical complications were reviewed including the potential for bleeding, infection, neurovascular injury, malunion, nonunion, loss of fixation, and possible need for additional surgery.  She understands and accepts and agrees to the planned procedure.  PROCEDURE IN DETAIL:  After undergoing routine preop evaluation, the patient received prophylactic antibiotics, and brought to the operating room, placed supine on the operating table, underwent smooth induction of a general endotracheal anesthesia.  Positioned on the fracture table and appropriately padded and protected.  The right leg in a well leg holder.  Left leg in gentle longitudinal traction.  I placed a Foley catheter at this point.  Fluoroscopic imaging was then obtained, which confirmed good alignment of the fracture site.  The left hip girdle region was then sterilely  prepped and draped in standard fashion.  Time- out was called.  A 3-cm longitudinal incision was then made over the lateral aspect of the thigh of the starting point determined using fluoroscopic imaging to help identify appropriate positioning for the placement of her guidepins.  Dissection then carried deeply to the deep fascia and the lateral margin of the femoral cortex was then exposed with blunt dissection.  A series of threaded tip guide pins was then directed up into the femoral neck and head with proper positioning confirmed fluoroscopically.  These were all then measured and a series of cannulated 7.3 stain steel screws were then passed over the guidewires up into the femoral neck and head to the proper position with fluoroscopic imaging used to confirm overall good alignment and good position of the hardware.  Good purchase was achieved.  Final fluoroscopic images were then obtained.  The wound was irrigated, closed with 2-0 Vicryl for the subcu layer and intracuticular 3-0 Monocryl for the skin, followed by Steri-Strips.  Dry dressings was then placed.  The patient was removed from the traction, placed supine, extubated and transferred to the recovery room in stable condition.     Vania Rea. Kiowa Peifer, M.D.     KMS/MEDQ  D:  11/18/2012  T:  11/19/2012  Job:  [id number]

## 2012-11-19 NOTE — Evaluation (Addendum)
Occupational Therapy Evaluation Patient Details Name: Carolyn [name] MRN: [id number] DOB: [birth year] Today's Date: 11/19/2012 Time: 4098-1191 OT Time Calculation (min): 24 min  OT Assessment / Plan / Recommendation History of present illness Carolyn [name] is a 72 y.o. female with a past medical history of chronic objective pulmonary disease, chronic hypoxemic respiratory failure, requiring 2 L supplemental oxygen at baseline, history of CVA with residual left-sided weakness in 2012, presently a resident at an assisted facility. She reports being in her usual state health, this afternoon while standing up from her wheelchair, walked around and got tangled up with her oxygen line. Patient tripped and fell on her left side. She was unable to get up for which she called the nursing staff. She was on the floor, [facility] though continued to have severe left hip pain. Patient was brought to the emergent apartment at [facility] today where initial imaging studies showing  a mild displaced fracture of left femoral neck. S/P LLE ORIF   Clinical Impression   This 72 yo female admitted and underwent above presents to acute OT with deficits below. Pt very pleasant and motivated and will benefit from continued OT on acute and HHOT to get back to an I/Mod I level at ALF.    OT Assessment  Patient needs continued OT Services    Follow Up Recommendations  Home health OT;Supervision/Assistance - 24 hour (at ALF)       Equipment Recommendations  3 in 1 bedside comode       Frequency  Min 2X/week    Precautions / Restrictions Precautions Precautions: Fall Restrictions Weight Bearing Restrictions: Yes LLE Weight Bearing: Partial weight bearing LLE Partial Weight Bearing Percentage or Pounds: 50 Other Position/Activity Restrictions: 50%   Pertinent Vitals/Pain 3/10 pain in LLE; no intervention needed    ADL  Eating/Feeding: Independent Where Assessed - Eating/Feeding: Chair Grooming: Set  up Where Assessed - Grooming: Unsupported sitting Upper Body Bathing: Set up Where Assessed - Upper Body Bathing: Unsupported sitting Lower Body Bathing: Maximal assistance Where Assessed - Lower Body Bathing: Supported sit to stand Upper Body Dressing: Minimal assistance Where Assessed - Upper Body Dressing: Unsupported sitting Lower Body Dressing: +1 Total assistance Where Assessed - Lower Body Dressing: Supported sit to stand Toilet Transfer: Minimal assistance Toilet Transfer Method: Sit to Barista:  (Bed>6 feet forward> sit in recliner behind her) Therapist, nutritional and Hygiene: Moderate assistance Where Assessed - Toileting Clothing Manipulation and Hygiene: Sit to stand from 3-in-1 or toilet Equipment Used: Gait belt;Rolling walker Transfers/Ambulation Related to ADLs: min A for all with RW ADL Comments: Pt reports that staff at ALF can help her with anything she needs including up to EOB and to [facility] or bathroom as well as BADLs (she did not need A with these PTA)    OT Diagnosis: Generalized weakness;Acute pain  OT Problem List: Decreased strength;Impaired balance (sitting and/or standing);Pain;Decreased knowledge of use of DME or AE OT Treatment Interventions: Self-care/ADL training;Balance training;DME and/or AE instruction;Patient/family education   OT Goals(Current goals can be found in the care plan section) Acute Rehab OT Goals Patient Stated Goal: To be able to go back to ALF OT Goal Formulation: With patient Time For Goal Achievement: 11/26/12 Potential to Achieve Goals: Good ADL Goals Pt Will Perform Grooming: with min guard assist;standing (at sink 2 tasks) Pt Will Perform Lower Body Bathing: with mod assist;with adaptive equipment;sit to/from stand Pt Will Perform Lower Body Dressing: with mod assist;with adaptive equipment;sit to/from  stand Pt Will Transfer to Toilet: with min guard assist;ambulating;bedside commode (over  toilet or comfort height toilet with rails) Pt Will Perform Toileting - Clothing Manipulation and hygiene: with min guard assist;sit to/from stand Additional ADL Goal #1: Pt  will be able to get in/OOB with Min guard A   Visit Information  Last OT Received On: 11/19/12 Assistance Needed: +2 (only to follow with chair) PT/OT Co-Evaluation/Treatment: Yes History of Present Illness: Carolyn [name] is a 72 y.o. female with a past medical history of chronic objective pulmonary disease, chronic hypoxemic respiratory failure, requiring 2 L supplemental oxygen at baseline, history of CVA with residual left-sided weakness in 2012, presently a resident at an assisted facility. She reports being in her usual state health, this afternoon while standing up from her wheelchair, walked around and got tangled up with her oxygen line. Patient tripped and fell on her left side. She was unable to get up for which she called the nursing staff. She was on the floor, [facility] though continued to have severe left hip pain. Patient was brought to the emergent apartment at [facility] today where initial imaging studies showing  a mild displaced fracture of left femoral neck. S/P LLE ORIF       Prior Functioning     Home Living Family/patient expects to be discharged to:: Assisted living Home Equipment: Krystal Clark - 2 wheels;Wheelchair - manual (O2 on 2 liters at rest; 3 liters with therapy) Additional Comments: pt sleeps with 3 pillows behind head at night, states she is short of breath when in supine. Prior Function Level of Independence: Independent with assistive device(s) Comments: pushes wheelchair  to amb so she can put her O2 in it  Communication Communication: No difficulties Dominant Hand: Right         Vision/Perception Vision - History Patient Visual Report: No change from baseline   Cognition  Cognition Arousal/Alertness: Awake/alert Behavior During Therapy: WFL for tasks  assessed/performed Overall Cognitive Status: Within Functional Limits for tasks assessed    Extremity/Trunk Assessment Upper Extremity Assessment Upper Extremity Assessment: Overall WFL for tasks assessed     Mobility Bed Mobility Details for Bed Mobility Assistance: Pt up at EOB upon arrival Transfers Transfers: Sit to Stand;Stand to Sit Sit to Stand: 4: Min assist;With upper extremity assist;From bed Stand to Sit: 4: Min assist;With upper extremity assist;With armrests;To chair/3-in-1 Details for Transfer Assistance: VCs for safe hand placement           End of Session OT - End of Session Equipment Utilized During Treatment: Gait belt;Rolling walker Activity Tolerance: Patient tolerated treatment well Patient left: in chair;with call bell/phone within reach Nurse Communication:  (Nursing had already had pt up to [facility])       Evette Georges [phone] 11/19/2012, 10:10 AM

## 2012-11-19 NOTE — Care Management Utilization Note (Signed)
Utilization review completed. Traeson Dusza, RN BSN 

## 2012-11-19 NOTE — Progress Notes (Signed)
Carolyn [name]  MRN: [id number] DOB/Age: 70-21-1942 70 y.o. Physician: Jacquelyne Balint Procedure: Procedure(s) (LRB): CANNULATED HIP PINNING (Left)     Subjective: Complaints of hip pain when up as expected but otherwise doing well. Tolerating po's  Vital Signs Temp:  [97.9 F (36.6 C)-99.4 F (37.4 C)] 99 F (37.2 C) (11/10 1345) Pulse Rate:  [93-120] 95 (11/10 1345) Resp:  [16-30] 18 (11/10 1345) BP: (104-146)/(45-75) 121/66 mmHg (11/10 1345) SpO2:  [91 %-100 %] 100 % (11/10 1345)  Lab Results  Recent Labs  11/18/12 1530 11/19/12 0553  WBC 9.4 6.9  HGB 12.2 10.0*  HCT 38.6 31.6*  PLT 271 217   BMET  Recent Labs  11/18/12 1530 11/19/12 0553  NA 143 142  K 4.0 4.2  CL 102 103  CO2 36* 35*  GLUCOSE 81 98  BUN 16 10  CREATININE 0.85 0.83  CALCIUM 10.1 9.1   INR  Date Value Range Status  01/09/2012 1.02  0.00 - 1.49 Final     Exam Left hip dressing with just scant bleeding NVI Lots of bruising into thigh which is very tender for her        Plan Cont to mobilize Medical care pre primary team  [facility] for Dr.Kevin Supple 11/19/2012, 3:06 PM

## 2012-11-20 ENCOUNTER — Encounter (HOSPITAL_COMMUNITY): Payer: Self-pay | Admitting: General Practice

## 2012-11-20 LAB — BASIC METABOLIC PANEL
Calcium: 9.2 mg/dL (ref 8.4–10.5)
GFR calc Af Amer: >90 mL/min (ref >90)
GFR calc non Af Amer: 83 mL/min — ABNORMAL LOW (ref >90)
Potassium: 3.8 mEq/L (ref 3.5–5.1)
Sodium: 140 mEq/L (ref 135–145)

## 2012-11-20 LAB — CBC
MCH: 32.3 pg (ref 26.0–34.0)
MCHC: 32.6 g/dL (ref 30.0–36.0)
Platelets: 184 10*3/uL (ref 150–400)
RDW: 13 % (ref 11.5–15.5)

## 2012-11-20 MED ORDER — ENOXAPARIN SODIUM 30 MG/0.3ML ~~LOC~~ SOLN: 30.0000 mg | SUBCUTANEOUS | Status: DC

## 2012-11-20 MED ORDER — HYDROCODONE-ACETAMINOPHEN 5-325 MG PO TABS: 1.0000 | ORAL_TABLET | ORAL | Status: DC | PRN

## 2012-11-20 MED ORDER — DSS 100 MG PO CAPS: 100.0000 mg | ORAL_CAPSULE | Freq: Two times a day (BID) | ORAL | Status: DC

## 2012-11-20 MED ORDER — POLYETHYLENE GLYCOL 3350 17 G PO PACK: 17.0000 g | PACK | Freq: Every day | ORAL | Status: DC

## 2012-11-20 MED ORDER — OXYCODONE HCL 5 MG PO TABS: 5.0000 mg | ORAL_TABLET | Freq: Four times a day (QID) | ORAL | Status: DC | PRN

## 2012-11-20 MED ORDER — ASPIRIN EC 325 MG PO TBEC: 325.0000 mg | DELAYED_RELEASE_TABLET | Freq: Every day | ORAL | Status: DC

## 2012-11-20 NOTE — Progress Notes (Signed)
11/20/12 Patient lives at [facility]. Spoke with patient about HHC and she would like to have PT/OT provided by [facility], which she had recently. Contacted [facility] and was informed that they will provide HHOT and OT with an order. Will send copy of PT/OT order with patient's FL2. Patient would like a 3N1, she has a walker and wheelchair. Contacted Darian with [facility] and requested 3N1 be delivered to patient's room. Jacquelynn Cree RN, BSN, CCM

## 2012-11-20 NOTE — Progress Notes (Signed)
Patient being discharged to ALF [facility] post L femoral neck fracture with ORIF 11/9. She is being transported via ambulance. Facility is aware.

## 2012-11-20 NOTE — Progress Notes (Signed)
Physical Therapy Treatment Patient Details Name: Carolyn [name] MRN: [id number] DOB: 1940/11/02 Today's Date: 11/20/2012 Time: 6578-4696 PT Time Calculation (min): 29 min  PT Assessment / Plan / Recommendation  History of Present Illness Carolyn [name] is a 72 y.o. female with a past medical history of chronic objective pulmonary disease, chronic hypoxemic respiratory failure, requiring 2 L supplemental oxygen at baseline, history of CVA with residual left-sided weakness in 2012, presently a resident at an assisted facility. She reports being in her usual state health, this afternoon while standing up from her wheelchair, walked around and got tangled up with her oxygen line. Patient tripped and fell on her left side. She was unable to get up for which she called the nursing staff. She was on the floor, [facility] though continued to have severe left hip pain. Patient was brought to the emergent apartment at [facility] today where initial imaging studies showing  a mild displaced fracture of left femoral neck. S/P LLE ORIF   PT Comments   Pt making progress towards physical therapy goals, and demonstrated the need for less assistance during transfers and ambulation. Pt has difficulty with bed mobility, an requires increased time to complete positioning tasks. From a mobility standpoint, pt is safe to d/c back to assisted-living, with HHPT to follow.  Follow Up Recommendations  Home health PT (At ALF)     Does the patient have the potential to tolerate intense rehabilitation     Barriers to Discharge        Equipment Recommendations  3in1 (PT)    Recommendations for Other Services    Frequency Min 5X/week   Progress towards PT Goals Progress towards PT goals: Progressing toward goals  Plan Current plan remains appropriate    Precautions / Restrictions Precautions Precautions: Fall Restrictions Weight Bearing Restrictions: Yes LLE Weight Bearing: Partial weight bearing LLE Partial  Weight Bearing Percentage or Pounds: 50   Pertinent Vitals/Pain 6/10 at rest    Mobility  Bed Mobility Bed Mobility: Sit to Supine Sit to Supine: 3: Mod assist Details for Bed Mobility Assistance: Assist to elevate LE's onto bed from seated position.  Transfers Transfers: Sit to Stand;Stand to Sit Sit to Stand: 4: Min guard;With upper extremity assist;From chair/3-in-1;From bed Stand to Sit: 4: Min guard;To bed;To chair/3-in-1;With upper extremity assist Details for Transfer Assistance: VC's for hand placement on seated surface.  Ambulation/Gait Ambulation/Gait Assistance: 4: Min guard Ambulation Distance (Feet): 25 Feet Assistive device: Rolling walker Ambulation/Gait Assistance Details: Pt required cueing to continue with ambulation, as she likes to stop and tell a story often. Fatigues quickly and chair follow is safest. Gait Pattern: Step-to pattern;Decreased stride length;Trunk rotated posteriorly on left Gait velocity: Decreased Stairs: No    Exercises General Exercises - Lower Extremity Long Arc Quad: 10 reps Hip ABduction/ADduction: 10 reps   PT Diagnosis:    PT Problem List:   PT Treatment Interventions:     PT Goals (current goals can now be found in the care plan section) Acute Rehab PT Goals Patient Stated Goal: To be able to go back to ALF PT Goal Formulation: With patient Time For Goal Achievement: 11/26/12 Potential to Achieve Goals: Good  Visit Information  Last PT Received On: 11/20/12 Assistance Needed: +2 (Chair follow) History of Present Illness: Carolyn [name] is a 72 y.o. female with a past medical history of chronic objective pulmonary disease, chronic hypoxemic respiratory failure, requiring 2 L supplemental oxygen at baseline, history of CVA with residual left-sided weakness  in 2012, presently a resident at an assisted facility. She reports being in her usual state health, this afternoon while standing up from her wheelchair, walked around and got  tangled up with her oxygen line. Patient tripped and fell on her left side. She was unable to get up for which she called the nursing staff. She was on the floor, [facility] though continued to have severe left hip pain. Patient was brought to the emergent apartment at [facility] today where initial imaging studies showing  a mild displaced fracture of left femoral neck. S/P LLE ORIF    Subjective Data  Patient Stated Goal: To be able to go back to ALF   Cognition  Cognition Arousal/Alertness: Awake/alert Behavior During Therapy: WFL for tasks assessed/performed Overall Cognitive Status: Within Functional Limits for tasks assessed    Balance     End of Session PT - End of Session Equipment Utilized During Treatment: Gait belt Activity Tolerance: Patient tolerated treatment well Patient left: in bed;with call bell/phone within reach Nurse Communication: Mobility status   GP     Ruthann Cancer 11/20/2012, 12:24 PM  Ruthann Cancer, PT, DPT [phone]

## 2012-11-20 NOTE — Progress Notes (Signed)
Occupational Therapy Treatment Patient Details Name: Carolyn [name] MRN: [id number] DOB: Sep 20, 1940 Today's Date: 11/20/2012 Time: 1191-4782 OT Time Calculation (min): 15 min  OT Assessment / Plan / Recommendation  History of present illness Carolyn [name] is a 72 y.o. female with a past medical history of chronic objective pulmonary disease, chronic hypoxemic respiratory failure, requiring 2 L supplemental oxygen at baseline, history of CVA with residual left-sided weakness in 2012, presently a resident at an assisted facility. She reports being in her usual state health, this afternoon while standing up from her wheelchair, walked around and got tangled up with her oxygen line. Patient tripped and fell on her left side. She was unable to get up for which she called the nursing staff. She was on the floor, [facility] though continued to have severe left hip pain. Patient was brought to the emergent apartment at [facility] today where initial imaging studies showing  a mild displaced fracture of left femoral neck. S/P LLE ORIF   OT comments  Pt progressing toward goals. Anticipate d/c later this afternoon. Continue to recommend HHOT at ALF.  Follow Up Recommendations  Home health OT (ALF)    Barriers to Discharge       Equipment Recommendations  3 in 1 bedside comode    Recommendations for Other Services    Frequency Min 2X/week   Progress towards OT Goals Progress towards OT goals: Progressing toward goals  Plan Discharge plan remains appropriate    Precautions / Restrictions Precautions Precautions: Fall Restrictions Weight Bearing Restrictions: Yes LLE Weight Bearing: Partial weight bearing LLE Partial Weight Bearing Percentage or Pounds: 50   Pertinent Vitals/Pain See vitals    ADL  Grooming: Performed;Min guard Where Assessed - Grooming: Supported standing Toilet Transfer: Performed;Min guard Statistician Method: Sit to Barista: Raised toilet  seat with arms (or 3-in-1 over toilet) Toileting - Clothing Manipulation and Hygiene: Performed;Minimal assistance Where Assessed - Engineer, mining and Hygiene: Sit to stand from 3-in-1 or toilet Equipment Used: Rolling walker Transfers/Ambulation Related to ADLs: Min guard with RW ADL Comments: Cueing for safety with RW    OT Diagnosis:    OT Problem List:   OT Treatment Interventions:     OT Goals(current goals can now be found in the care plan section) Acute Rehab OT Goals Patient Stated Goal: To be able to go back to ALF OT Goal Formulation: With patient Time For Goal Achievement: 11/26/12 Potential to Achieve Goals: Good ADL Goals Pt Will Perform Grooming: with min guard assist;standing Pt Will Perform Lower Body Bathing: with mod assist;with adaptive equipment;sit to/from stand Pt Will Perform Lower Body Dressing: with mod assist;with adaptive equipment;sit to/from stand Pt Will Transfer to Toilet: with min guard assist;ambulating;bedside commode Pt Will Perform Toileting - Clothing Manipulation and hygiene: with min guard assist;sit to/from stand Additional ADL Goal #1: Pt  will be able to get in/OOB with Min guard A   Visit Information  Last OT Received On: 11/20/12 Assistance Needed: +2 (chair follow) History of Present Illness: Carolyn [name] is a 72 y.o. female with a past medical history of chronic objective pulmonary disease, chronic hypoxemic respiratory failure, requiring 2 L supplemental oxygen at baseline, history of CVA with residual left-sided weakness in 2012, presently a resident at an assisted facility. She reports being in her usual state health, this afternoon while standing up from her wheelchair, walked around and got tangled up with her oxygen line. Patient tripped and fell on her left  side. She was unable to get up for which she called the nursing staff. She was on the floor, [facility] though continued to have severe left hip pain. Patient was  brought to the emergent apartment at [facility] today where initial imaging studies showing  a mild displaced fracture of left femoral neck. S/P LLE ORIF    Subjective Data      Prior Functioning  Home Living Family/patient expects to be discharged to:: Assisted living Home Equipment: Krystal Clark - 2 wheels;Wheelchair - manual Additional Comments: 2L/min supplemental O2 at rest, 3L/min with therapy at baseline. Prior Function Level of Independence: Independent with assistive device(s)    Cognition  Cognition Arousal/Alertness: Awake/alert Behavior During Therapy: WFL for tasks assessed/performed Overall Cognitive Status: Within Functional Limits for tasks assessed    Mobility  Bed Mobility Bed Mobility: Not assessed (pt up in chair)  Transfers Transfers: Sit to Stand;Stand to Sit Sit to Stand: 4: Min guard;From chair/3-in-1 Stand to Sit: 4: Min guard;To chair/3-in-1 Details for Transfer Assistance: VC's for hand placement on seated surface.     Exercises     Balance     End of Session OT - End of Session Equipment Utilized During Treatment: Gait belt;Rolling walker;Oxygen Activity Tolerance: Patient tolerated treatment well Patient left: in chair;with call bell/phone within reach  GO    11/20/2012 Cipriano Mile OTR/L Pager [phone] Office [phone]  Cipriano Mile 11/20/2012, 3:52 PM

## 2012-11-20 NOTE — Progress Notes (Addendum)
Carolyn [name]  MRN: [id number] DOB/Age: [birth year] 72 y.o. Physician: Jacquelyne Balint Procedure: Procedure(s) (LRB): CANNULATED HIP PINNING (Left)     Subjective: Patient doing well and feels like ready to DC back to assisted living. Feels with the home health and the help there she will manage just fine  Vital Signs Temp:  [98.4 F (36.9 C)-99 F (37.2 C)] 98.7 F (37.1 C) (11/11 0613) Pulse Rate:  [92-95] 95 (11/11 0613) Resp:  [13-18] 13 (11/11 0721) BP: (121-128)/(62-66) 128/62 mmHg (11/11 0613) SpO2:  [96 %-100 %] 97 % (11/11 0721)  Lab Results  Recent Labs  11/19/12 0553 11/20/12 0540  WBC 6.9 6.4  HGB 10.0* 9.7*  HCT 31.6* 29.8*  PLT 217 184   BMET  Recent Labs  11/19/12 0553 11/20/12 0540  NA 142 140  K 4.2 3.8  CL 103 103  CO2 35* 34*  GLUCOSE 98 104*  BUN 10 9  CREATININE 0.83 0.77  CALCIUM 9.1 9.2   INR  Date Value Range Status  01/09/2012 1.02  0.00 - 1.49 Final     Exam Up working with PT Moving hip and leg well        Plan Dressing change prior to DC FU with [place] in 2 weeks ASA for DVT prophylaxis Hydrocodone rx left  Belkis Norbeck for Dr.Kevin Supple 11/20/2012, 10:40 AM   will DC lovenox

## 2012-11-20 NOTE — Discharge Summary (Signed)
Physician Discharge Summary  Carolyn [name] AVW:[id number] DOB: 1940-04-24 DOA: 11/18/2012  PCP: Eartha Inch, MD  Admit date: 11/18/2012 Discharge date: 11/20/2012  Time spent: 35 minutes  Recommendations for Outpatient Follow-up:  1. Need to follow up with Dr supple post op.  2. Need cbc to follow hb level.   Discharge Diagnoses:    Left displaced femoral neck fracture   COPD Gold D , frequent exacerbations   H/O: CVA (cerebrovascular accident)   Hypertension   Chronic diastolic heart failure, NYHA class 1   Chronic respiratory failure   Fall   Discharge Condition: Stable.   Diet recommendation: Heart Healthy   History of present illness:  Carolyn [name] is a 72 y.o. female with a past medical history of chronic objective pulmonary disease, chronic hypoxemic respiratory failure, requiring 2 L supplemental oxygen at baseline, history of CVA with residual left-sided weakness in 2012, presently a resident at an assisted facility. She reports being in her usual state health, this afternoon while standing up from her wheelchair, walked around and got tangled up with her oxygen line. Patient tripped and fell on her left side. She was unable to get up for which she called the nursing staff. She was on the floor, [facility] though continued to have severe left hip pain. Patient was brought to the emergent apartment at [facility] today where initial imaging studies showing a mild displaced fracture of left femoral neck. Dr. Rennis Chris of orthopedic surgery was consulted from the emergency department. She denies symptoms prior to the fall. There were no reports of chest pain, worsening shortness of breath, palpitations, dizziness, or worsening focal neurological deficits. Patient at baseline had poor tolerance to physical exertion with her history of advanced COPD and chronic hypoxemic respiratory failure. She reports being able to walk short distances independently. She presently denies chest pain  shortness of breath fevers chills nausea vomiting bloody stools dysuria hematuria.    Hospital Course:  1-Left femoral neck fracture. Status post mechanical fall at her assistant living facility.  -status post cannulated hip pinning 11-09 -PT recommend HH Pt at assistance living facility.  -minimize narcotics  -she will be discharge on Lovenox for DVT prophylaxis.   2-Chronic diastolic heart failure: compensated  -strict intake and output  -heart healthy diet   3-Chronic resp failure: due to obstructive pulmonary disease, advanced. Currently compensated, use oxygen at baseline. Will provide when necessary duo nebs.  -Chest x-ray did not show acute pulmonary disease; just chronic findings and hyperventilation consistent with COPD.   4-Status post fall. Mechanical in nature.  -minimize narcotics. -patient at high risk for fall at baseline  5-History of cerebrovascular accident: Patient had a CVA in 2012 which resulted in residual left-sided weakness. Will continue Plavix  6-Hypertension. Blood pressure stable with her last blood pressure 133/82. Will continue metoprolol 25 mg by mouth daily 7-Acute blood loss anemia: post op expected. Hb at 9.7. Please monitor cbc on Lovenox.   Procedures: Left hip cannulated pinning 11-09   Consultations:  Dr Rennis Chris  Discharge Exam: Filed Vitals:   11/20/12 0721  BP:   Pulse:   Temp:   Resp: 13    General: no distress.  Cardiovascular: S 1, S 2 RRR Respiratory: CTA Left hip with clean dressing, echymosis.   Discharge Instructions  Discharge Orders   Future Orders Complete By Expires   Diet - low sodium heart healthy  As directed    Increase activity slowly  As directed    Partial weight  bearing  As directed    Questions:     % Body Weight:  50   Laterality:  left   Extremity:         Medication List    STOP taking these medications       loperamide 2 MG tablet  Commonly known as:  IMODIUM A-D      TAKE these  medications       acetaminophen 325 MG tablet  Commonly known as:  TYLENOL  Take 650 mg by mouth 3 (three) times daily as needed. For pain     ACIDOPHILUS EXTRA STRENGTH PO  Take 1 tablet by mouth daily.     albuterol 108 (90 BASE) MCG/ACT inhaler  Commonly known as:  PROVENTIL HFA;VENTOLIN HFA  Inhale 2 puffs into the lungs every 4 (four) hours as needed. For shortness of breath     albuterol (2.5 MG/3ML) 0.083% nebulizer solution  Commonly known as:  PROVENTIL  Take 2.5 mg by nebulization every 6 (six) hours as needed for wheezing or shortness of breath. For wheezing     budesonide-formoterol 160-4.5 MCG/ACT inhaler  Commonly known as:  SYMBICORT  Inhale 2 puffs into the lungs 2 (two) times daily.     clopidogrel 75 MG tablet  Commonly known as:  PLAVIX  Take 75 mg by mouth at bedtime.     diazepam 5 MG tablet  Commonly known as:  VALIUM  Take 5 mg by mouth every 8 (eight) hours as needed for anxiety or sleep.     DSS 100 MG Caps  Take 100 mg by mouth 2 (two) times daily.     enoxaparin 30 MG/0.3ML injection  Commonly known as:  LOVENOX  Inject 0.3 mLs (30 mg total) into the skin daily.     gabapentin 300 MG capsule  Commonly known as:  NEURONTIN  Take 300 mg by mouth 2 (two) times daily. Takes at 8a and 2p     gabapentin 300 MG capsule  Commonly known as:  NEURONTIN  Take 600 mg by mouth at bedtime.     magnesium oxide 400 MG tablet  Commonly known as:  MAG-OX  Take 400 mg by mouth 2 (two) times daily.     metoprolol succinate 25 MG 24 hr tablet  Commonly known as:  TOPROL-XL  Take 25 mg by mouth daily.     mirtazapine 15 MG tablet  Commonly known as:  REMERON  Take 15 mg by mouth at bedtime.     ondansetron 4 MG tablet  Commonly known as:  ZOFRAN  Take 4 mg by mouth every 6 (six) hours as needed. For nausea     oxybutynin 10 MG 24 hr tablet  Commonly known as:  DITROPAN-XL  Take 10 mg by mouth daily.     polyethylene glycol packet  Commonly known  as:  MIRALAX / GLYCOLAX  Take 17 g by mouth daily.     pravastatin 20 MG tablet  Commonly known as:  PRAVACHOL  Take 20 mg by mouth every evening.     promethazine 25 MG tablet  Commonly known as:  PHENERGAN  Take 12.5 mg by mouth every 6 (six) hours as needed. For nausea       Allergies  Allergen Reactions  . Penicillins Anaphylaxis    Anaphylaxis  . Codeine Rash    Reaction unknown       Follow-up Information   Follow up with BADGER,MICHAEL C, MD In 1 week.   Specialty:  Family Medicine   Contact information:   [address] [place] [address] [phone]       Follow up with Senaida Lange, MD. Call in 2 weeks.   Specialty:  Orthopedic Surgery   Contact information:   [address] [place] [address] [phone]        The results of significant diagnostics from this hospitalization (including imaging, microbiology, ancillary and laboratory) are listed below for reference.    Significant Diagnostic Studies: Dg Hip Complete Left  11/18/2012   CLINICAL DATA:  Fall  EXAM: LEFT HIP - COMPLETE 2+ VIEW  COMPARISON:  None.  FINDINGS: 3 views of the left hip submitted. There is mild displaced fracture of left femoral neck. Diffuse osteopenia is noted.  IMPRESSION: Mild displaced fracture of left femoral neck. Diffuse osteopenia.   Electronically Signed   By: Natasha Mead M.D.   On: 11/18/2012 14:02   Dg Hip Operative Left  11/19/2012   CLINICAL DATA:  Left femoral neck fracture.  EXAM: OPERATIVE LEFT HIP  COMPARISON:  Earlier on the same date.  FINDINGS: Two C arm views of the left hip demonstrate interval placement of 4 screws bridging the previously demonstrated femoral neck fracture. Anatomic position and alignment on these views.  IMPRESSION: Screw fixation of the previously demonstrated left femoral neck fracture   Electronically Signed   By: Gordan Payment M.D.   On: 11/19/2012 02:06   Dg Chest Port 1 View  11/18/2012   CLINICAL DATA:   Preop  EXAM: PORTABLE CHEST - 1 VIEW  COMPARISON:  09/21/2012  FINDINGS: Cardiomediastinal silhouette is stable. Hyperinflation again noted No acute infiltrate or pleural effusion. No pulmonary edema.  IMPRESSION: No active disease. Hyperinflation again noted.   Electronically Signed   By: Natasha Mead M.D.   On: 11/18/2012 15:11    Microbiology: Recent Results (from the past 240 hour(s))  MRSA PCR SCREENING     Status: None   Collection Time    11/18/12  5:48 PM      Result Value Range Status   MRSA by PCR NEGATIVE  NEGATIVE Final   Comment:            The GeneXpert MRSA Assay (FDA     approved for NASAL specimens     only), is one component of a     comprehensive MRSA colonization     surveillance program. It is not     intended to diagnose MRSA     infection nor to guide or     monitor treatment for     MRSA infections.  URINE CULTURE     Status: None   Collection Time    11/18/12 11:17 PM      Result Value Range Status   Specimen Description URINE, CATHETERIZED   Final   Special Requests ADDED 2344   Final   Culture  Setup Time     Final   Value: 11/18/2012 23:59     Performed at [facility]   Colony Count     Final   Value: NO GROWTH     Performed at [facility]   Culture     Final   Value: NO GROWTH     Performed at [facility]   Report Status 11/19/2012 FINAL   Final     Labs: Basic Metabolic Panel:  Recent Labs Lab 11/18/12 1530 11/19/12 0553 11/20/12 0540  NA 143 142 140  K 4.0 4.2  3.8  CL 102 103 103  CO2 36* 35* 34*  GLUCOSE 81 98 104*  BUN 16 10 9   CREATININE 0.85 0.83 0.77  CALCIUM 10.1 9.1 9.2   Liver Function Tests: No results found for this basename: AST, ALT, ALKPHOS, BILITOT, PROT, ALBUMIN,  in the last 168 hours No results found for this basename: LIPASE, AMYLASE,  in the last 168 hours No results found for this basename: AMMONIA,  in the last 168 hours CBC:  Recent Labs Lab 11/18/12 1530 11/19/12 0553  11/20/12 0540  WBC 9.4 6.9 6.4  NEUTROABS 7.3  --   --   HGB 12.2 10.0* 9.7*  HCT 38.6 31.6* 29.8*  MCV 100.3* 101.3* 99.3  PLT 271 217 184   Cardiac Enzymes: No results found for this basename: CKTOTAL, CKMB, CKMBINDEX, TROPONINI,  in the last 168 hours BNP: BNP (last 3 results) No results found for this basename: PROBNP,  in the last 8760 hours CBG: No results found for this basename: GLUCAP,  in the last 168 hours     Signed:  Katarina Riebe  [facility] 11/20/2012, 7:40 AM

## 2012-11-21 ENCOUNTER — Encounter (HOSPITAL_COMMUNITY): Payer: Self-pay | Admitting: Orthopedic Surgery

## 2012-12-28 ENCOUNTER — Encounter (HOSPITAL_COMMUNITY): Payer: Self-pay | Admitting: Emergency Medicine

## 2012-12-28 ENCOUNTER — Emergency Department (HOSPITAL_COMMUNITY)
Admission: EM | Admit: 2012-12-28 | Discharge: 2012-12-29 | Disposition: A | Discharge status: Home / Self Care | Payer: Medicare Other | Attending: Emergency Medicine | Admitting: Emergency Medicine

## 2012-12-28 ENCOUNTER — Emergency Department (HOSPITAL_COMMUNITY): Payer: Medicare Other

## 2012-12-28 DIAGNOSIS — R51 Headache: Secondary | ICD-10-CM | POA: Insufficient documentation

## 2012-12-28 DIAGNOSIS — J449 Chronic obstructive pulmonary disease, unspecified: Secondary | ICD-10-CM | POA: Insufficient documentation

## 2012-12-28 DIAGNOSIS — R0602 Shortness of breath: Secondary | ICD-10-CM | POA: Insufficient documentation

## 2012-12-28 DIAGNOSIS — I10 Essential (primary) hypertension: Secondary | ICD-10-CM | POA: Insufficient documentation

## 2012-12-28 DIAGNOSIS — Z7902 Long term (current) use of antithrombotics/antiplatelets: Secondary | ICD-10-CM | POA: Insufficient documentation

## 2012-12-28 DIAGNOSIS — M129 Arthropathy, unspecified: Secondary | ICD-10-CM | POA: Insufficient documentation

## 2012-12-28 DIAGNOSIS — Z789 Other specified health status: Secondary | ICD-10-CM | POA: Insufficient documentation

## 2012-12-28 DIAGNOSIS — Z8669 Personal history of other diseases of the nervous system and sense organs: Secondary | ICD-10-CM | POA: Insufficient documentation

## 2012-12-28 DIAGNOSIS — Z885 Allergy status to narcotic agent status: Secondary | ICD-10-CM | POA: Insufficient documentation

## 2012-12-28 DIAGNOSIS — Z8673 Personal history of transient ischemic attack (TIA), and cerebral infarction without residual deficits: Secondary | ICD-10-CM | POA: Insufficient documentation

## 2012-12-28 DIAGNOSIS — Z880 Allergy status to penicillin: Secondary | ICD-10-CM | POA: Insufficient documentation

## 2012-12-28 DIAGNOSIS — E7800 Pure hypercholesterolemia, unspecified: Secondary | ICD-10-CM | POA: Insufficient documentation

## 2012-12-28 DIAGNOSIS — Z79899 Other long term (current) drug therapy: Secondary | ICD-10-CM | POA: Insufficient documentation

## 2012-12-28 DIAGNOSIS — N390 Urinary tract infection, site not specified: Secondary | ICD-10-CM | POA: Insufficient documentation

## 2012-12-28 DIAGNOSIS — I471 Supraventricular tachycardia, unspecified: Secondary | ICD-10-CM | POA: Insufficient documentation

## 2012-12-28 DIAGNOSIS — J4489 Other specified chronic obstructive pulmonary disease: Secondary | ICD-10-CM | POA: Insufficient documentation

## 2012-12-28 DIAGNOSIS — Z9981 Dependence on supplemental oxygen: Secondary | ICD-10-CM | POA: Insufficient documentation

## 2012-12-28 DIAGNOSIS — D649 Anemia, unspecified: Secondary | ICD-10-CM | POA: Insufficient documentation

## 2012-12-28 DIAGNOSIS — Z7982 Long term (current) use of aspirin: Secondary | ICD-10-CM | POA: Insufficient documentation

## 2012-12-28 DIAGNOSIS — F17200 Nicotine dependence, unspecified, uncomplicated: Secondary | ICD-10-CM | POA: Insufficient documentation

## 2012-12-28 HISTORY — DX: Pure hypercholesterolemia, unspecified: E78.00

## 2012-12-28 LAB — URINALYSIS, ROUTINE W REFLEX MICROSCOPIC
Bilirubin Urine: NEGATIVE
Glucose, UA: NEGATIVE mg/dL
Hgb urine dipstick: NEGATIVE
Ketones, ur: NEGATIVE mg/dL
Nitrite: NEGATIVE
Specific Gravity, Urine: 1.01 (ref 1.005–1.030)
Urobilinogen, UA: 0.2 mg/dL (ref 0.0–1.0)
pH: 6 (ref 5.0–8.0)

## 2012-12-28 LAB — BASIC METABOLIC PANEL
BUN: 18 mg/dL (ref 6–23)
Calcium: 9.2 mg/dL (ref 8.4–10.5)
Creatinine, Ser: 0.89 mg/dL (ref 0.50–1.10)
GFR calc Af Amer: 74 mL/min — ABNORMAL LOW (ref >90)
GFR calc non Af Amer: 64 mL/min — ABNORMAL LOW (ref >90)
Glucose, Bld: 87 mg/dL (ref 70–99)
Potassium: 4.3 mEq/L (ref 3.5–5.1)

## 2012-12-28 LAB — CBC WITH DIFFERENTIAL/PLATELET
Basophils Absolute: 0 10*3/uL (ref 0.0–0.1)
Basophils Relative: 0 % (ref 0–1)
Eosinophils Absolute: 0.2 10*3/uL (ref 0.0–0.7)
Eosinophils Relative: 2 % (ref 0–5)
Hemoglobin: 11 g/dL — ABNORMAL LOW (ref 12.0–15.0)
MCH: 32.1 pg (ref 26.0–34.0)
MCHC: 31.6 g/dL (ref 30.0–36.0)
MCV: 101.5 fL — ABNORMAL HIGH (ref 78.0–100.0)
Monocytes Relative: 6 % (ref 3–12)
Neutrophils Relative %: 70 % (ref 43–77)

## 2012-12-28 LAB — URINE MICROSCOPIC-ADD ON

## 2012-12-28 MED ORDER — ONDANSETRON HCL 4 MG/2ML IJ SOLN
4.0000 mg | Freq: Once | INTRAMUSCULAR | Status: AC
  Administered 2012-12-28: 4 mg via INTRAVENOUS
  Filled 2012-12-28: qty 2

## 2012-12-28 MED ORDER — SODIUM CHLORIDE 0.9 % IV SOLN
INTRAVENOUS | Status: DC
  Administered 2012-12-28: 17:00:00 via INTRAVENOUS

## 2012-12-28 MED ORDER — CIPROFLOXACIN HCL 500 MG PO TABS: 500.0000 mg | ORAL_TABLET | Freq: Two times a day (BID) | ORAL | Status: DC

## 2012-12-28 MED ORDER — DILTIAZEM HCL ER COATED BEADS 120 MG PO CP24: 120.0000 mg | ORAL_CAPSULE | Freq: Every day | ORAL | Status: AC

## 2012-12-28 MED ORDER — DILTIAZEM HCL ER COATED BEADS 120 MG PO CP24: 120.0000 mg | ORAL_CAPSULE | Freq: Every day | ORAL | Status: DC

## 2012-12-28 MED ORDER — DILTIAZEM HCL ER COATED BEADS 120 MG PO CP24
120.0000 mg | ORAL_CAPSULE | Freq: Every day | ORAL | Status: DC
  Administered 2012-12-28: 120 mg via ORAL
  Filled 2012-12-28: qty 1

## 2012-12-28 NOTE — ED Provider Notes (Signed)
CSN: [id number]     Arrival date & time 12/28/12  1551 History   First MD Initiated Contact with Patient 12/28/12 1639     Chief Complaint  Patient presents with  . Tachycardia   (Consider location/radiation/quality/duration/timing/severity/associated sxs/prior Treatment) The history is provided by the patient and the EMS personnel.  72 year old female from [facility] brought in by EMS for Tachycardia. Monitoring showed SVT rate up to 180. Patient given 6 mg  adenosine and 4 liters [place] oxygen with conversion. Now feels fine. Rapid heart rate associated with SOB. No chest pain. Patient has COPD and is normally on 2 liters [place] oxygen. In addition with complaint of urinary frequency and thinks she has UTI. Denies past history of heart palpitations.  Past Medical History  Diagnosis Date  . Hypertension   . COPD (chronic obstructive pulmonary disease)   . Anemia   . Headaches, cluster   . Wears glasses   . CVA (cerebral vascular accident) 01/2010  . Complication of anesthesia     " trouble waking up "  . PONV (postoperative nausea and vomiting)   . Shortness of breath   . Arthritis   . Hypercholesteremia    Past Surgical History  Procedure Laterality Date  . Abdominal hysterectomy    . Varicose vein surgery    . Left hand    . Cholecystectomy  07/20/10    Dr Andrey Campanile; Lap chole with ioc  . Hip pinning,cannulated Left 11/2012    Dr Rennis Chris  . Hand surgery Left     BALL JOINT  . Hip pinning,cannulated Left 11/18/2012    Procedure: CANNULATED HIP PINNING;  Surgeon: Senaida Lange, MD;  Location: [facility] OR;  Service: Orthopedics;  Laterality: Left;   Family History  Problem Relation Age of Onset  . Heart disease Father   . Heart disease Mother   . Diabetes Father   . Hypertension Mother   . Cancer Maternal Aunt     ovarian, breast  . Lung disease Father    History  Substance Use Topics  . Smoking status: Current Some Day Smoker -- 0.10 packs/day for 30 years    Types: Cigarettes   . Smokeless tobacco: Never Used  . Alcohol Use: No   OB History   Grav Para Term Preterm Abortions TAB SAB Ect Mult Living                 Review of Systems  Constitutional: Negative for fever.  HENT: Negative for congestion.   Eyes: Negative for visual disturbance.  Respiratory: Positive for shortness of breath.   Cardiovascular: Positive for palpitations. Negative for chest pain.  Gastrointestinal: Negative for nausea, vomiting and abdominal pain.  Genitourinary: Positive for dysuria and frequency.  Musculoskeletal: Negative for back pain.  Skin: Negative for rash.  Neurological: Positive for headaches.  Hematological: Does not bruise/bleed easily.  Psychiatric/Behavioral: Negative for confusion.    Allergies  Penicillins and Codeine  Home Medications   Current Outpatient Rx  Name  Route  Sig  Dispense  Refill  . acetaminophen (TYLENOL) 325 MG tablet   Oral   Take 650 mg by mouth 3 (three) times daily as needed. For pain         . albuterol (PROVENTIL HFA;VENTOLIN HFA) 108 (90 BASE) MCG/ACT inhaler   Inhalation   Inhale 2 puffs into the lungs every 4 (four) hours as needed. For shortness of breath         . albuterol (PROVENTIL) (2.5 MG/3ML)  0.083% nebulizer solution   Nebulization   Take 2.5 mg by nebulization every 6 (six) hours as needed for wheezing or shortness of breath. For wheezing         . aspirin EC 325 MG tablet   Oral   Take 1 tablet (325 mg total) by mouth daily.   30 tablet   0   . budesonide-formoterol (SYMBICORT) 160-4.5 MCG/ACT inhaler   Inhalation   Inhale 2 puffs into the lungs 2 (two) times daily.         . clopidogrel (PLAVIX) 75 MG tablet   Oral   Take 75 mg by mouth at bedtime.         . diazepam (VALIUM) 5 MG tablet   Oral   Take 5 mg by mouth every 8 (eight) hours as needed for anxiety or sleep.         Marland Kitchen gabapentin (NEURONTIN) 300 MG capsule   Oral   Take 300 mg by mouth 2 (two) times daily. Takes at 8a and 2p          . HYDROcodone-acetaminophen (NORCO) 5-325 MG per tablet   Oral   Take 1-2 tablets by mouth every 4 (four) hours as needed.   50 tablet   0   . Lactobacillus (ACIDOPHILUS EXTRA STRENGTH PO)   Oral   Take 1 tablet by mouth daily.         . magnesium oxide (MAG-OX) 400 MG tablet   Oral   Take 400 mg by mouth 2 (two) times daily.         . metoprolol succinate (TOPROL-XL) 25 MG 24 hr tablet   Oral   Take 25 mg by mouth daily.         . mirtazapine (REMERON) 15 MG tablet   Oral   Take 15 mg by mouth at bedtime.         . ondansetron (ZOFRAN) 4 MG tablet   Oral   Take 4 mg by mouth every 6 (six) hours as needed. For nausea         . oxybutynin (DITROPAN-XL) 10 MG 24 hr tablet   Oral   Take 10 mg by mouth daily.         . polyethylene glycol (MIRALAX / GLYCOLAX) packet   Oral   Take 17 g by mouth daily.   14 each   0   . ciprofloxacin (CIPRO) 500 MG tablet   Oral   Take 1 tablet (500 mg total) by mouth 2 (two) times daily.   10 tablet   0   . diltiazem (CARDIZEM CD) 120 MG 24 hr capsule   Oral   Take 1 capsule (120 mg total) by mouth daily.   14 capsule   2   . pravastatin (PRAVACHOL) 20 MG tablet   Oral   Take 20 mg by mouth every evening.          . promethazine (PHENERGAN) 25 MG tablet   Oral   Take 12.5 mg by mouth every 6 (six) hours as needed. For nausea          BP 116/94  Pulse 87  Temp(Src) 97.9 F (36.6 C) (Oral)  Resp 26  SpO2 100% Physical Exam  Nursing note and vitals reviewed. Constitutional: She is oriented to person, place, and time. She appears well-developed and well-nourished. No distress.  HENT:  Head: Normocephalic and atraumatic.  Eyes: Conjunctivae and EOM are normal. Pupils are equal, round, and reactive to  light.  Neck: Normal range of motion.  Cardiovascular: Normal rate, regular rhythm and normal heart sounds.   No murmur heard. Pulmonary/Chest: Effort normal and breath sounds normal. No respiratory  distress. She has no rales.  Abdominal: Soft. Bowel sounds are normal. There is no tenderness.  Musculoskeletal: Normal range of motion. She exhibits no edema.  Neurological: She is alert and oriented to person, place, and time. No cranial nerve deficit. She exhibits normal muscle tone. Coordination normal.  Skin: Skin is warm. No rash noted.    ED Course  Procedures (including critical care time) Labs Review Labs Reviewed  URINALYSIS, ROUTINE W REFLEX MICROSCOPIC - Abnormal; Notable for the following:    APPearance CLOUDY (*)    Leukocytes, UA TRACE (*)    All other components within normal limits  CBC WITH DIFFERENTIAL - Abnormal; Notable for the following:    RBC 3.43 (*)    Hemoglobin 11.0 (*)    HCT 34.8 (*)    MCV 101.5 (*)    All other components within normal limits  BASIC METABOLIC PANEL - Abnormal; Notable for the following:    GFR calc non Af Amer 64 (*)    GFR calc Af Amer 74 (*)    All other components within normal limits  URINE MICROSCOPIC-ADD ON - Abnormal; Notable for the following:    Bacteria, UA MANY (*)    All other components within normal limits  URINE CULTURE   Results for orders placed during the hospital encounter of 12/28/12  URINE CULTURE      Result Value Range   Specimen Description URINE, CLEAN CATCH     Special Requests NONE     Culture  Setup Time       Value: 12/28/2012 21:05  PREVIOUSLY REPORTED AS ENTEROCOCCUS SPECIES CORRECTED RESULTS CALLED TO: TONY FESTERMAN 01/02/13 AT 1040 AM BY MORAC     Performed at [facility] Count       Value: >=100,000 COLONIES/ML     Performed at [facility]   Culture       Value: Multiple bacterial morphotypes present, none predominant. Suggest appropriate recollection if clinically indicated.     Performed at [facility]   Report Status 01/02/2013 FINAL    URINALYSIS, ROUTINE W REFLEX MICROSCOPIC      Result Value Range   Color, Urine YELLOW  YELLOW   APPearance  CLOUDY (*) CLEAR   Specific Gravity, Urine 1.010  1.005 - 1.030   pH 6.0  5.0 - 8.0   Glucose, UA NEGATIVE  NEGATIVE mg/dL   Hgb urine dipstick NEGATIVE  NEGATIVE   Bilirubin Urine NEGATIVE  NEGATIVE   Ketones, ur NEGATIVE  NEGATIVE mg/dL   Protein, ur NEGATIVE  NEGATIVE mg/dL   Urobilinogen, UA 0.2  0.0 - 1.0 mg/dL   Nitrite NEGATIVE  NEGATIVE   Leukocytes, UA TRACE (*) NEGATIVE  CBC WITH DIFFERENTIAL      Result Value Range   WBC 8.3  4.0 - 10.5 K/uL   RBC 3.43 (*) 3.87 - 5.11 MIL/uL   Hemoglobin 11.0 (*) 12.0 - 15.0 g/dL   HCT 16.1 (*) 09.6 - 04.5 %   MCV 101.5 (*) 78.0 - 100.0 fL   MCH 32.1  26.0 - 34.0 pg   MCHC 31.6  30.0 - 36.0 g/dL   RDW 40.9  81.1 - 91.4 %   Platelets 227  150 - 400 K/uL   Neutrophils Relative % 70  43 -  77 %   Neutro Abs 5.9  1.7 - 7.7 K/uL   Lymphocytes Relative 21  12 - 46 %   Lymphs Abs 1.7  0.7 - 4.0 K/uL   Monocytes Relative 6  3 - 12 %   Monocytes Absolute 0.5  0.1 - 1.0 K/uL   Eosinophils Relative 2  0 - 5 %   Eosinophils Absolute 0.2  0.0 - 0.7 K/uL   Basophils Relative 0  0 - 1 %   Basophils Absolute 0.0  0.0 - 0.1 K/uL  BASIC METABOLIC PANEL      Result Value Range   Sodium 139  135 - 145 mEq/L   Potassium 4.3  3.5 - 5.1 mEq/L   Chloride 104  96 - 112 mEq/L   CO2 31  19 - 32 mEq/L   Glucose, Bld 87  70 - 99 mg/dL   BUN 18  6 - 23 mg/dL   Creatinine, Ser 1.61  0.50 - 1.10 mg/dL   Calcium 9.2  8.4 - 09.6 mg/dL   GFR calc non Af Amer 64 (*) >90 mL/min   GFR calc Af Amer 74 (*) >90 mL/min  URINE MICROSCOPIC-ADD ON      Result Value Range   Squamous Epithelial / LPF RARE  RARE   WBC, UA 3-6  <3 WBC/hpf   Bacteria, UA MANY (*) RARE    Imaging Review No results found.  EKG Interpretation    Date/Time:  Friday December 28 2012 16:05:03 EST Ventricular Rate:  115 PR Interval:  157 QRS Duration: 67 QT Interval:  305 QTC Calculation: 422 R Axis:   51 Text Interpretation:  Sinus tachycardia Nonspecific ST and T wave abnormality  Confirmed by Tynasia Mccaul  MD, Zavannah Deblois ([id number]) on 12/28/2012 4:10:56 PM Also confirmed by Deretha Emory  MD, Bently Morath ([id number])  on 12/28/2012 7:34:53 PM           Results for orders placed during the hospital encounter of 12/28/12  URINE CULTURE      Result Value Range   Specimen Description URINE, CLEAN CATCH     Special Requests NONE     Culture  Setup Time       Value: 12/28/2012 21:05  PREVIOUSLY REPORTED AS ENTEROCOCCUS SPECIES CORRECTED RESULTS CALLED TO: TONY FESTERMAN 01/02/13 AT 1040 AM BY MORAC     Performed at [facility] Count       Value: >=100,000 COLONIES/ML     Performed at [facility]   Culture       Value: Multiple bacterial morphotypes present, none predominant. Suggest appropriate recollection if clinically indicated.     Performed at [facility]   Report Status 01/02/2013 FINAL    URINALYSIS, ROUTINE W REFLEX MICROSCOPIC      Result Value Range   Color, Urine YELLOW  YELLOW   APPearance CLOUDY (*) CLEAR   Specific Gravity, Urine 1.010  1.005 - 1.030   pH 6.0  5.0 - 8.0   Glucose, UA NEGATIVE  NEGATIVE mg/dL   Hgb urine dipstick NEGATIVE  NEGATIVE   Bilirubin Urine NEGATIVE  NEGATIVE   Ketones, ur NEGATIVE  NEGATIVE mg/dL   Protein, ur NEGATIVE  NEGATIVE mg/dL   Urobilinogen, UA 0.2  0.0 - 1.0 mg/dL   Nitrite NEGATIVE  NEGATIVE   Leukocytes, UA TRACE (*) NEGATIVE  CBC WITH DIFFERENTIAL      Result Value Range   WBC 8.3  4.0 - 10.5 K/uL   RBC 3.43 (*) 3.87 -  5.11 MIL/uL   Hemoglobin 11.0 (*) 12.0 - 15.0 g/dL   HCT 16.1 (*) 09.6 - 04.5 %   MCV 101.5 (*) 78.0 - 100.0 fL   MCH 32.1  26.0 - 34.0 pg   MCHC 31.6  30.0 - 36.0 g/dL   RDW 40.9  81.1 - 91.4 %   Platelets 227  150 - 400 K/uL   Neutrophils Relative % 70  43 - 77 %   Neutro Abs 5.9  1.7 - 7.7 K/uL   Lymphocytes Relative 21  12 - 46 %   Lymphs Abs 1.7  0.7 - 4.0 K/uL   Monocytes Relative 6  3 - 12 %   Monocytes Absolute 0.5  0.1 - 1.0 K/uL   Eosinophils Relative 2  0 -  5 %   Eosinophils Absolute 0.2  0.0 - 0.7 K/uL   Basophils Relative 0  0 - 1 %   Basophils Absolute 0.0  0.0 - 0.1 K/uL  BASIC METABOLIC PANEL      Result Value Range   Sodium 139  135 - 145 mEq/L   Potassium 4.3  3.5 - 5.1 mEq/L   Chloride 104  96 - 112 mEq/L   CO2 31  19 - 32 mEq/L   Glucose, Bld 87  70 - 99 mg/dL   BUN 18  6 - 23 mg/dL   Creatinine, Ser 7.82  0.50 - 1.10 mg/dL   Calcium 9.2  8.4 - 95.6 mg/dL   GFR calc non Af Amer 64 (*) >90 mL/min   GFR calc Af Amer 74 (*) >90 mL/min  URINE MICROSCOPIC-ADD ON      Result Value Range   Squamous Epithelial / LPF RARE  RARE   WBC, UA 3-6  <3 WBC/hpf   Bacteria, UA MANY (*) RARE   Dg Chest 2 View  12/28/2012   CLINICAL DATA:  Tachycardia.  Hypertension.  History of smoking.  EXAM: CHEST  2 VIEW  COMPARISON:  Chest x-ray 11/28/2012.  FINDINGS: Lungs appear mildly hyperexpanded with some pruning of the pulmonary vasculature in the periphery, suggestive of underlying COPD. No definite acute consolidative airspace disease. No pleural effusions. Slight prominence of the right suprahilar region is noted, but is favored to be positional secondary to rightward rotation and indicative of normal vascular structures. This appearance is similar in retrospect compared to numerous prior examinations dating back to at least 01/25/2010, and there was no pulmonary nodule or mass in this region on the intervening chest CT from 01/13/2012. No other suspicious appearing pulmonary nodule or mass is noted. Vascular calcifications are present in the thoracic aorta and great vessels of the mediastinum. No pneumothorax.  IMPRESSION: 1. No radiographic evidence of acute cardiopulmonary disease. 2. Emphysematous changes redemonstrated. 3. Atherosclerosis.   Electronically Signed   By: Trudie Reed M.D.   On: 12/28/2012 18:20      MDM   1. SVT (supraventricular tachycardia)   2. UTI (lower urinary tract infection)    Patient now stable but with episode of  SVT with heart rate up to 180. Work up without significant findings. Discussed with cardiology and they recommend cardizem CD 120 mg qd and outpatient follow up. In addition may have developing UTI will treat with Cipro since allergic to Penicillins. Patient wants to return to [facility] and prefers not to be admitted. Cardiac monitoring without further arrythmias.     Shelda Jakes, MD 01/09/13 [phone]

## 2012-12-28 NOTE — ED Notes (Signed)
Pharmacy notified pt still waiting for Cardizem CD prior to discharge.

## 2012-12-28 NOTE — ED Notes (Signed)
Report from Angela, RN.

## 2012-12-28 NOTE — ED Notes (Signed)
[facility] paged. 

## 2012-12-28 NOTE — ED Notes (Signed)
Awaiting medication from pharmacy and then [facility] to transport pt.

## 2012-12-28 NOTE — ED Notes (Signed)
Patient transported to X-ray 

## 2012-12-28 NOTE — ED Notes (Addendum)
Pt from assisted living [facility] via [facility].  On EMS arrival pt was in SVT HR 185 BP 90/50, given 6 mg adenosine and 300 mL NS, rhythm converted to ST HR 110 BP 120 80.  Pt does not have a hx of SVT.  Pt also c/o UTI symptoms, burning, frequency, headache, "sick to stomach."  Pt denies pain, hx of COPD.  Pt in NAD, A&O.

## 2012-12-28 NOTE — ED Notes (Signed)
No changes.  Still awaiting medication.

## 2012-12-28 NOTE — ED Notes (Addendum)
Assisted patient to [facility] she became extremly SOB her sats remained in the upper 90's but she was very winded and is requesting some assistance to open up her lungs. Her O2 sats are 95% at 3L of oxygen. MD and RN are aware.

## 2012-12-29 NOTE — ED Notes (Signed)
Spoke with [facility]. Informed them that the pt will be returning to the facility shortly.

## 2013-01-02 ENCOUNTER — Telehealth (HOSPITAL_COMMUNITY): Payer: Self-pay

## 2013-01-02 LAB — URINE CULTURE: Colony Count: >=100000

## 2013-01-08 NOTE — ED Notes (Signed)
Unable to contact patient via phone. Sent letter. °

## 2013-03-25 ENCOUNTER — Ambulatory Visit (INDEPENDENT_AMBULATORY_CARE_PROVIDER_SITE_OTHER): Payer: Medicare Other | Admitting: Podiatry

## 2013-03-25 ENCOUNTER — Encounter: Payer: Self-pay | Admitting: Podiatry

## 2013-03-25 VITALS — BP 140/89 | HR 88 | Resp 20 | Ht 61.5 in | Wt 144.0 lb

## 2013-03-25 DIAGNOSIS — M779 Enthesopathy, unspecified: Secondary | ICD-10-CM

## 2013-03-25 MED ORDER — TRIAMCINOLONE ACETONIDE 10 MG/ML IJ SUSP
10.0000 mg | Freq: Once | INTRAMUSCULAR | Status: AC
Start: 2013-03-25 — End: 2013-03-25
  Administered 2013-03-25: 10 mg

## 2013-03-25 NOTE — Progress Notes (Signed)
   Subjective:    Patient ID: Carolyn [name], female    DOB: 11/29/1940, 73 y.o.   MRN: [id number] P{t states continues to have the pain as in April, the tightness. HPI    Review of Systems     Objective:   Physical Exam        Assessment & Plan:

## 2013-03-27 NOTE — Progress Notes (Signed)
Subjective:     Patient ID: Carolyn ShellMaria L Kuyper, female   DOB: 08/29/1940, 73 y.o.   MRN: [id number]  HPI patient states she started to develop the pain on her left foot again but was doing very well for a long period of time   Review of Systems     Objective:   Physical Exam Neurovascular status intact with pain to the medial side left foot around the anterior tibial incision and slightly proximal to this point    Assessment:     Tendinitis left which is reoccur    Plan:     Injected the tendon sheath 3 mg Kenalog 5 mg Xylocaine Marcaine mixture and advised on physical therapy and reduced activity for the next several weeks. Did discuss the chances for rupture associated with injection treatment of tendons

## 2013-12-27 ENCOUNTER — Encounter (HOSPITAL_COMMUNITY): Payer: Self-pay | Admitting: Emergency Medicine

## 2013-12-27 ENCOUNTER — Inpatient Hospital Stay (HOSPITAL_COMMUNITY)
Admission: EM | Admit: 2013-12-27 | Discharge: 2013-12-30 | DRG: 190 | Disposition: A | Discharge status: Skilled Nursing Facility | Payer: Medicare Other | Attending: Internal Medicine | Admitting: Internal Medicine

## 2013-12-27 ENCOUNTER — Emergency Department (HOSPITAL_COMMUNITY): Payer: Medicare Other

## 2013-12-27 DIAGNOSIS — R0600 Dyspnea, unspecified: Secondary | ICD-10-CM | POA: Diagnosis not present

## 2013-12-27 DIAGNOSIS — R079 Chest pain, unspecified: Secondary | ICD-10-CM | POA: Diagnosis present

## 2013-12-27 DIAGNOSIS — J9620 Acute and chronic respiratory failure, unspecified whether with hypoxia or hypercapnia: Secondary | ICD-10-CM | POA: Diagnosis present

## 2013-12-27 DIAGNOSIS — Z720 Tobacco use: Secondary | ICD-10-CM | POA: Diagnosis present

## 2013-12-27 DIAGNOSIS — M1990 Unspecified osteoarthritis, unspecified site: Secondary | ICD-10-CM | POA: Diagnosis present

## 2013-12-27 DIAGNOSIS — J9610 Chronic respiratory failure, unspecified whether with hypoxia or hypercapnia: Secondary | ICD-10-CM | POA: Diagnosis present

## 2013-12-27 DIAGNOSIS — R0789 Other chest pain: Secondary | ICD-10-CM | POA: Diagnosis present

## 2013-12-27 DIAGNOSIS — F17210 Nicotine dependence, cigarettes, uncomplicated: Secondary | ICD-10-CM | POA: Diagnosis present

## 2013-12-27 DIAGNOSIS — J441 Chronic obstructive pulmonary disease with (acute) exacerbation: Secondary | ICD-10-CM | POA: Diagnosis not present

## 2013-12-27 DIAGNOSIS — N390 Urinary tract infection, site not specified: Secondary | ICD-10-CM | POA: Diagnosis present

## 2013-12-27 DIAGNOSIS — Z66 Do not resuscitate: Secondary | ICD-10-CM | POA: Diagnosis present

## 2013-12-27 DIAGNOSIS — Z9981 Dependence on supplemental oxygen: Secondary | ICD-10-CM

## 2013-12-27 DIAGNOSIS — Z8673 Personal history of transient ischemic attack (TIA), and cerebral infarction without residual deficits: Secondary | ICD-10-CM

## 2013-12-27 DIAGNOSIS — I5032 Chronic diastolic (congestive) heart failure: Secondary | ICD-10-CM | POA: Diagnosis present

## 2013-12-27 DIAGNOSIS — Z79899 Other long term (current) drug therapy: Secondary | ICD-10-CM

## 2013-12-27 DIAGNOSIS — J449 Chronic obstructive pulmonary disease, unspecified: Secondary | ICD-10-CM

## 2013-12-27 DIAGNOSIS — E780 Pure hypercholesterolemia: Secondary | ICD-10-CM | POA: Diagnosis present

## 2013-12-27 DIAGNOSIS — Z7982 Long term (current) use of aspirin: Secondary | ICD-10-CM

## 2013-12-27 DIAGNOSIS — I10 Essential (primary) hypertension: Secondary | ICD-10-CM | POA: Diagnosis present

## 2013-12-27 LAB — BASIC METABOLIC PANEL
Anion gap: 8 (ref 5–15)
BUN: 19 mg/dL (ref 6–23)
CO2: 33 mEq/L — ABNORMAL HIGH (ref 19–32)
Calcium: 9.9 mg/dL (ref 8.4–10.5)
Chloride: 100 mEq/L (ref 96–112)
Creatinine, Ser: 0.83 mg/dL (ref 0.50–1.10)
GFR calc Af Amer: 80 mL/min — ABNORMAL LOW (ref >90)
GFR, EST NON AFRICAN AMERICAN: 69 mL/min — AB (ref >90)
GLUCOSE: 97 mg/dL (ref 70–99)
Potassium: 4.7 mEq/L (ref 3.7–5.3)
Sodium: 141 mEq/L (ref 137–147)

## 2013-12-27 LAB — TROPONIN I

## 2013-12-27 LAB — CBC WITH DIFFERENTIAL/PLATELET
Basophils Absolute: 0.1 10*3/uL (ref 0.0–0.1)
Basophils Relative: 1 % (ref 0–1)
EOS ABS: 0.2 10*3/uL (ref 0.0–0.7)
EOS PCT: 3 % (ref 0–5)
HEMATOCRIT: 37.4 % (ref 36.0–46.0)
HEMOGLOBIN: 11.6 g/dL — AB (ref 12.0–15.0)
LYMPHS ABS: 2 10*3/uL (ref 0.7–4.0)
Lymphocytes Relative: 26 % (ref 12–46)
MCH: 30.2 pg (ref 26.0–34.0)
MCHC: 31 g/dL (ref 30.0–36.0)
MCV: 97.4 fL (ref 78.0–100.0)
MONOS PCT: 6 % (ref 3–12)
Monocytes Absolute: 0.4 10*3/uL (ref 0.1–1.0)
Neutro Abs: 4.9 10*3/uL (ref 1.7–7.7)
Neutrophils Relative %: 64 % (ref 43–77)
Platelets: 234 10*3/uL (ref 150–400)
RBC: 3.84 MIL/uL — AB (ref 3.87–5.11)
RDW: 13 % (ref 11.5–15.5)
WBC: 7.5 10*3/uL (ref 4.0–10.5)

## 2013-12-27 LAB — I-STAT TROPONIN, ED: Troponin i, poc: 0 ng/mL (ref 0.00–0.08)

## 2013-12-27 LAB — PRO B NATRIURETIC PEPTIDE: Pro B Natriuretic peptide (BNP): 83.5 pg/mL (ref 0–125)

## 2013-12-27 MED ORDER — ALBUTEROL SULFATE (2.5 MG/3ML) 0.083% IN NEBU
5.0000 mg | INHALATION_SOLUTION | Freq: Once | RESPIRATORY_TRACT | Status: AC
  Administered 2013-12-27: 5 mg via RESPIRATORY_TRACT
  Filled 2013-12-27: qty 6

## 2013-12-27 MED ORDER — METHYLPREDNISOLONE SODIUM SUCC 125 MG IJ SOLR
60.0000 mg | Freq: Once | INTRAMUSCULAR | Status: AC
  Administered 2013-12-27: 60 mg via INTRAVENOUS
  Filled 2013-12-27: qty 2

## 2013-12-27 MED ORDER — IPRATROPIUM BROMIDE 0.02 % IN SOLN
0.5000 mg | Freq: Once | RESPIRATORY_TRACT | Status: AC
  Administered 2013-12-27: 0.5 mg via RESPIRATORY_TRACT
  Filled 2013-12-27: qty 2.5

## 2013-12-27 MED ORDER — MORPHINE SULFATE 4 MG/ML IJ SOLN
4.0000 mg | Freq: Once | INTRAMUSCULAR | Status: AC
  Administered 2013-12-27: 4 mg via INTRAVENOUS
  Filled 2013-12-27: qty 1

## 2013-12-27 NOTE — ED Notes (Signed)
Pt from [facility], with complaint of intermittent chest pain since 0200 this morning. Pt received 324 aspirin and 2 nitro while in route, pt pain free at this time, only complaint is pressure. Pt alert and oriented. Wears 3 L O2 at facility daily. Vitals stable at this time

## 2013-12-27 NOTE — ED Provider Notes (Signed)
Pt w hx htn, copd on home o2, c/o episodes of feeling tight/discomfort mid chest.  Episodes occurs at rest, states last several seconds.  Not related to activity level or exertion. No associated nv or diaphoresis. States always sob at baseline, on 3 liters home o2, denies any new or worsening doe.  Pt current symptom free. Episodes began 20+ hours ago.  Denies hx cad, although fam hx cad.  No pleuritic pain, no leg pain or swelling.  Mild wheezing earlier, improved w neb.   Initial trop neg.  Recheck 2330, w any activity in room, standing, pt markedly short of breath, tightness.  Recurrent wheezing  Given multiple cardiac risk factors, severe copd at baseline, will admit for cp r/o, obs, additional neb txs.  Iv steroids.   Med service contacted for admission.     Carolyn RootsKevin E Wali Reinheimer, MD 12/27/13 [phone]

## 2013-12-27 NOTE — ED Provider Notes (Signed)
CSN: [id number]     Arrival date & time 12/27/13  1849 History   First MD Initiated Contact with Patient 12/27/13 1850     Chief Complaint  Patient presents with  . Chest Pain     (Consider location/radiation/quality/duration/timing/severity/associated sxs/prior Treatment) HPI Comments: Patient with past medical history of hypertension, COPD, CVA, CHF, and hyperlipidemia presents to the emergency department with chief complaint of chest pressure.  She states that the symptoms started yesterday. She states that the pain has been intermittent. She states that it has been progressively worsening when he comes back. She has taken 324 of aspirin and 2 nitroglycerin given by EMS. Patient states that it feels like there is a "hippopotamus" sitting on my chest. Patient stays at an assisted living center, and wears 3 L of oxygen have baseline for her COPD. She did report improvement with the nitroglycerin. She states that her level of discomfort is still about a 6 out of 10. She is followed by Dr. Cyndia BentBadger of primary care, and Dr. Jacinto HalimGanji of cardiology.  The history is provided by the patient. No language interpreter was used.    Past Medical History  Diagnosis Date  . Hypertension   . COPD (chronic obstructive pulmonary disease)   . Anemia   . Headaches, cluster   . Wears glasses   . CVA (cerebral vascular accident) 01/2010  . Complication of anesthesia     " trouble waking up "  . PONV (postoperative nausea and vomiting)   . Shortness of breath   . Arthritis   . Hypercholesteremia    Past Surgical History  Procedure Laterality Date  . Abdominal hysterectomy    . Varicose vein surgery    . Left hand    . Cholecystectomy  07/20/10    Dr Andrey CampanileWilson; Lap chole with ioc  . Hip pinning,cannulated Left 11/2012    Dr Rennis ChrisSupple  . Hand surgery Left     BALL JOINT  . Hip pinning,cannulated Left 11/18/2012    Procedure: CANNULATED HIP PINNING;  Surgeon: Senaida LangeKevin M Supple, MD;  Location: [facility] OR;  Service:  Orthopedics;  Laterality: Left;   Family History  Problem Relation Age of Onset  . Heart disease Father   . Heart disease Mother   . Diabetes Father   . Hypertension Mother   . Cancer Maternal Aunt     ovarian, breast  . Lung disease Father    History  Substance Use Topics  . Smoking status: Current Some Day Smoker -- 0.10 packs/day for 30 years    Types: Cigarettes  . Smokeless tobacco: Never Used  . Alcohol Use: No   OB History    No data available     Review of Systems  Constitutional: Negative for fever and chills.  Respiratory: Positive for shortness of breath.   Cardiovascular: Positive for chest pain.  Gastrointestinal: Negative for nausea, vomiting, diarrhea and constipation.  Genitourinary: Negative for dysuria.  All other systems reviewed and are negative.     Allergies  Penicillins and Codeine  Home Medications   Prior to Admission medications   Medication Sig Start Date End Date Taking? Authorizing Provider  acetaminophen (TYLENOL) 325 MG tablet Take 650 mg by mouth 3 (three) times daily as needed. For pain    Historical Provider, MD  albuterol (PROVENTIL HFA;VENTOLIN HFA) 108 (90 BASE) MCG/ACT inhaler Inhale 2 puffs into the lungs every 4 (four) hours as needed. For shortness of breath    Historical Provider, MD  albuterol (PROVENTIL) (2.5  MG/3ML) 0.083% nebulizer solution Take 2.5 mg by nebulization every 6 (six) hours as needed for wheezing or shortness of breath. For wheezing 01/14/12   Ripudeep Jenna Luo, MD  aspirin EC 325 MG tablet Take 1 tablet (325 mg total) by mouth daily. 11/20/12   Tracy Shuford, PA-C  budesonide-formoterol (SYMBICORT) 160-4.5 MCG/ACT inhaler Inhale 2 puffs into the lungs 2 (two) times daily.    Historical Provider, MD  ciprofloxacin (CIPRO) 500 MG tablet Take 1 tablet (500 mg total) by mouth 2 (two) times daily. 12/28/12   Vanetta Mulders, MD  clopidogrel (PLAVIX) 75 MG tablet Take 75 mg by mouth at bedtime.    Historical Provider, MD   diazepam (VALIUM) 5 MG tablet Take 5 mg by mouth every 8 (eight) hours as needed for anxiety or sleep.    Historical Provider, MD  diltiazem (CARDIZEM CD) 120 MG 24 hr capsule Take 1 capsule (120 mg total) by mouth daily. 12/28/12   Vanetta Mulders, MD  gabapentin (NEURONTIN) 300 MG capsule Take 600 mg by mouth 3 (three) times daily.    Historical Provider, MD  HYDROcodone-acetaminophen (NORCO) 5-325 MG per tablet Take 1-2 tablets by mouth every 4 (four) hours as needed. 11/20/12   Tracy Shuford, PA-C  Lactobacillus (ACIDOPHILUS EXTRA STRENGTH PO) Take 1 tablet by mouth daily.    Historical Provider, MD  magnesium oxide (MAG-OX) 400 MG tablet Take 400 mg by mouth 2 (two) times daily.    Historical Provider, MD  metoprolol succinate (TOPROL-XL) 25 MG 24 hr tablet Take 25 mg by mouth daily.    Historical Provider, MD  mirtazapine (REMERON) 15 MG tablet Take 15 mg by mouth at bedtime.    Historical Provider, MD  ondansetron (ZOFRAN) 4 MG tablet Take 4 mg by mouth every 6 (six) hours as needed. For nausea    Historical Provider, MD  oxybutynin (DITROPAN-XL) 10 MG 24 hr tablet Take 10 mg by mouth daily.    Historical Provider, MD  polyethylene glycol (MIRALAX / GLYCOLAX) packet Take 17 g by mouth daily. 11/20/12   Belkys A Regalado, MD  pravastatin (PRAVACHOL) 20 MG tablet Take 20 mg by mouth every evening.     Historical Provider, MD  promethazine (PHENERGAN) 25 MG tablet Take 12.5 mg by mouth every 6 (six) hours as needed. For nausea    Historical Provider, MD   BP 129/70 mmHg  Pulse 91  Temp(Src) 98.2 F (36.8 C) (Oral)  Resp 19  SpO2 100% Physical Exam  Constitutional: She is oriented to person, place, and time. She appears well-developed and well-nourished.  HENT:  Head: Normocephalic and atraumatic.  Eyes: Conjunctivae and EOM are normal. Pupils are equal, round, and reactive to light.  Neck: Normal range of motion. Neck supple.  Cardiovascular: Normal rate and regular rhythm.  Exam  reveals no gallop and no friction rub.   No murmur heard. Pulmonary/Chest: Effort normal and breath sounds normal. No respiratory distress. She has no wheezes. She has no rales. She exhibits no tenderness.  Distant lung sounds  Abdominal: Soft. She exhibits no distension and no mass. There is no tenderness. There is no rebound and no guarding.  Musculoskeletal: Normal range of motion. She exhibits no edema or tenderness.  Neurological: She is alert and oriented to person, place, and time.  Skin: Skin is warm and dry.  Psychiatric: She has a normal mood and affect. Her behavior is normal. Judgment and thought content normal.  Nursing note and vitals reviewed.   ED Course  Procedures (including critical care time) Labs Review Labs Reviewed - No data to display  Imaging Review No results found.   EKG Interpretation   Date/Time:  Friday December 27 2013 18:56:45 EST Ventricular Rate:  94 PR Interval:  161 QRS Duration: 81 QT Interval:  346 QTC Calculation: 433 R Axis:   56 Text Interpretation:  Sinus rhythm No significant change since last  tracing Confirmed by STEINL  MD, Caryn BeeKEVIN ([id number]) on 12/27/2013 7:25:49 PM      MDM   Final diagnoses:  Chest pain  Chronic obstructive pulmonary disease, unspecified COPD, unspecified chronic bronchitis type  Dyspnea    Patient with chest pain and shortness of breath. Symptoms started yesterday and have been intermittent and worsening. She has extensive cardiac risk factors. She has received aspirin and nitroglycerin.  Patient with multiple cardiac risk factors, plan for admission for chest pain rule out. Shared visit with Dr. Denton LankSteinl.   Roxy Horsemanobert Ripley Lovecchio, PA-C 12/28/13 1517  Suzi RootsKevin E Steinl, MD 12/30/13 [phone]

## 2013-12-28 ENCOUNTER — Encounter (HOSPITAL_COMMUNITY): Payer: Self-pay | Admitting: Internal Medicine

## 2013-12-28 DIAGNOSIS — I10 Essential (primary) hypertension: Secondary | ICD-10-CM

## 2013-12-28 DIAGNOSIS — N390 Urinary tract infection, site not specified: Secondary | ICD-10-CM | POA: Diagnosis present

## 2013-12-28 DIAGNOSIS — M1990 Unspecified osteoarthritis, unspecified site: Secondary | ICD-10-CM | POA: Diagnosis present

## 2013-12-28 DIAGNOSIS — Z79899 Other long term (current) drug therapy: Secondary | ICD-10-CM | POA: Diagnosis not present

## 2013-12-28 DIAGNOSIS — Z8673 Personal history of transient ischemic attack (TIA), and cerebral infarction without residual deficits: Secondary | ICD-10-CM | POA: Diagnosis not present

## 2013-12-28 DIAGNOSIS — Z7982 Long term (current) use of aspirin: Secondary | ICD-10-CM | POA: Diagnosis not present

## 2013-12-28 DIAGNOSIS — E780 Pure hypercholesterolemia: Secondary | ICD-10-CM | POA: Diagnosis present

## 2013-12-28 DIAGNOSIS — F17210 Nicotine dependence, cigarettes, uncomplicated: Secondary | ICD-10-CM | POA: Diagnosis present

## 2013-12-28 DIAGNOSIS — I5032 Chronic diastolic (congestive) heart failure: Secondary | ICD-10-CM

## 2013-12-28 DIAGNOSIS — J441 Chronic obstructive pulmonary disease with (acute) exacerbation: Principal | ICD-10-CM

## 2013-12-28 DIAGNOSIS — Z9981 Dependence on supplemental oxygen: Secondary | ICD-10-CM | POA: Diagnosis not present

## 2013-12-28 DIAGNOSIS — I519 Heart disease, unspecified: Secondary | ICD-10-CM

## 2013-12-28 DIAGNOSIS — R0789 Other chest pain: Secondary | ICD-10-CM | POA: Diagnosis present

## 2013-12-28 DIAGNOSIS — Z66 Do not resuscitate: Secondary | ICD-10-CM | POA: Diagnosis present

## 2013-12-28 DIAGNOSIS — R079 Chest pain, unspecified: Secondary | ICD-10-CM | POA: Diagnosis present

## 2013-12-28 DIAGNOSIS — R0600 Dyspnea, unspecified: Secondary | ICD-10-CM | POA: Diagnosis present

## 2013-12-28 DIAGNOSIS — J9620 Acute and chronic respiratory failure, unspecified whether with hypoxia or hypercapnia: Secondary | ICD-10-CM | POA: Diagnosis present

## 2013-12-28 LAB — CBC
HCT: 36.4 % (ref 36.0–46.0)
Hemoglobin: 11.3 g/dL — ABNORMAL LOW (ref 12.0–15.0)
MCH: 30.5 pg (ref 26.0–34.0)
MCHC: 31 g/dL (ref 30.0–36.0)
MCV: 98.4 fL (ref 78.0–100.0)
Platelets: 232 10*3/uL (ref 150–400)
RBC: 3.7 MIL/uL — AB (ref 3.87–5.11)
RDW: 13.3 % (ref 11.5–15.5)
WBC: 10.8 10*3/uL — AB (ref 4.0–10.5)

## 2013-12-28 LAB — URINALYSIS, ROUTINE W REFLEX MICROSCOPIC
Bilirubin Urine: NEGATIVE
Glucose, UA: NEGATIVE mg/dL
Hgb urine dipstick: NEGATIVE
KETONES UR: 15 mg/dL — AB
NITRITE: NEGATIVE
PROTEIN: NEGATIVE mg/dL
Specific Gravity, Urine: 1.015 (ref 1.005–1.030)
Urobilinogen, UA: 0.2 mg/dL (ref 0.0–1.0)
pH: 7 (ref 5.0–8.0)

## 2013-12-28 LAB — TSH: TSH: 2.59 u[IU]/mL (ref 0.350–4.500)

## 2013-12-28 LAB — MAGNESIUM: Magnesium: 2.2 mg/dL (ref 1.5–2.5)

## 2013-12-28 LAB — COMPREHENSIVE METABOLIC PANEL
ALT: 10 U/L (ref 0–35)
AST: 13 U/L (ref 0–37)
Albumin: 3.5 g/dL (ref 3.5–5.2)
Alkaline Phosphatase: 81 U/L (ref 39–117)
Anion gap: 7 (ref 5–15)
BUN: 21 mg/dL (ref 6–23)
CO2: 33 meq/L — AB (ref 19–32)
CREATININE: 0.95 mg/dL (ref 0.50–1.10)
Calcium: 9.8 mg/dL (ref 8.4–10.5)
Chloride: 100 mEq/L (ref 96–112)
GFR calc Af Amer: 68 mL/min — ABNORMAL LOW (ref >90)
GFR calc non Af Amer: 59 mL/min — ABNORMAL LOW (ref >90)
Glucose, Bld: 147 mg/dL — ABNORMAL HIGH (ref 70–99)
Potassium: 4.3 mEq/L (ref 3.7–5.3)
Sodium: 140 mEq/L (ref 137–147)
Total Bilirubin: 0.2 mg/dL — ABNORMAL LOW (ref 0.3–1.2)
Total Protein: 6.2 g/dL (ref 6.0–8.3)

## 2013-12-28 LAB — PHOSPHORUS: Phosphorus: 4 mg/dL (ref 2.3–4.6)

## 2013-12-28 LAB — HEMOGLOBIN A1C
Hgb A1c MFr Bld: 5.3 % (ref <5.7)
Mean Plasma Glucose: 105 mg/dL (ref <117)

## 2013-12-28 LAB — URINE MICROSCOPIC-ADD ON

## 2013-12-28 LAB — TROPONIN I: Troponin I: <0.3 ng/mL (ref <0.30)

## 2013-12-28 MED ORDER — METHYLPREDNISOLONE SODIUM SUCC 125 MG IJ SOLR
60.0000 mg | Freq: Two times a day (BID) | INTRAMUSCULAR | Status: DC
  Administered 2013-12-28 (×2): 60 mg via INTRAVENOUS
  Filled 2013-12-28 (×4): qty 0.96

## 2013-12-28 MED ORDER — IPRATROPIUM BROMIDE 0.02 % IN SOLN
0.5000 mg | Freq: Four times a day (QID) | RESPIRATORY_TRACT | Status: DC
  Administered 2013-12-28 – 2013-12-29 (×6): 0.5 mg via RESPIRATORY_TRACT
  Filled 2013-12-28 (×6): qty 2.5

## 2013-12-28 MED ORDER — ONDANSETRON HCL 4 MG PO TABS: 4.0000 mg | ORAL_TABLET | Freq: Four times a day (QID) | ORAL | Status: DC | PRN

## 2013-12-28 MED ORDER — NICOTINE 21 MG/24HR TD PT24
21.0000 mg | MEDICATED_PATCH | Freq: Every day | TRANSDERMAL | Status: DC
  Administered 2013-12-28 – 2013-12-30 (×2): 21 mg via TRANSDERMAL
  Filled 2013-12-28 (×3): qty 1

## 2013-12-28 MED ORDER — SODIUM CHLORIDE 0.9 % IV SOLN: 250.0000 mL | INTRAVENOUS | Status: DC | PRN

## 2013-12-28 MED ORDER — CLOPIDOGREL BISULFATE 75 MG PO TABS
75.0000 mg | ORAL_TABLET | Freq: Every day | ORAL | Status: DC
  Administered 2013-12-28 – 2013-12-29 (×3): 75 mg via ORAL
  Filled 2013-12-28 (×4): qty 1

## 2013-12-28 MED ORDER — ASPIRIN EC 81 MG PO TBEC
81.0000 mg | DELAYED_RELEASE_TABLET | Freq: Every day | ORAL | Status: DC
  Administered 2013-12-28 – 2013-12-30 (×3): 81 mg via ORAL
  Filled 2013-12-28 (×4): qty 1

## 2013-12-28 MED ORDER — SODIUM CHLORIDE 0.9 % IJ SOLN: 3.0000 mL | INTRAMUSCULAR | Status: DC | PRN

## 2013-12-28 MED ORDER — CETYLPYRIDINIUM CHLORIDE 0.05 % MT LIQD
7.0000 mL | Freq: Two times a day (BID) | OROMUCOSAL | Status: DC
  Administered 2013-12-28 – 2013-12-29 (×3): 7 mL via OROMUCOSAL

## 2013-12-28 MED ORDER — BUDESONIDE-FORMOTEROL FUMARATE 160-4.5 MCG/ACT IN AERO
2.0000 | INHALATION_SPRAY | Freq: Two times a day (BID) | RESPIRATORY_TRACT | Status: DC
  Administered 2013-12-28 – 2013-12-30 (×5): 2 via RESPIRATORY_TRACT
  Filled 2013-12-28: qty 6

## 2013-12-28 MED ORDER — ENOXAPARIN SODIUM 40 MG/0.4ML ~~LOC~~ SOLN
40.0000 mg | SUBCUTANEOUS | Status: DC
  Administered 2013-12-28 – 2013-12-30 (×3): 40 mg via SUBCUTANEOUS
  Filled 2013-12-28 (×3): qty 0.4

## 2013-12-28 MED ORDER — DOCUSATE SODIUM 100 MG PO CAPS
100.0000 mg | ORAL_CAPSULE | Freq: Two times a day (BID) | ORAL | Status: DC
  Administered 2013-12-28: 100 mg via ORAL
  Filled 2013-12-28 (×6): qty 1

## 2013-12-28 MED ORDER — ACETAMINOPHEN 650 MG RE SUPP: 650.0000 mg | Freq: Four times a day (QID) | RECTAL | Status: DC | PRN

## 2013-12-28 MED ORDER — SODIUM CHLORIDE 0.9 % IJ SOLN
3.0000 mL | Freq: Two times a day (BID) | INTRAMUSCULAR | Status: DC
  Administered 2013-12-30: 3 mL via INTRAVENOUS

## 2013-12-28 MED ORDER — GUAIFENESIN ER 600 MG PO TB12
600.0000 mg | ORAL_TABLET | Freq: Two times a day (BID) | ORAL | Status: DC
  Administered 2013-12-28 – 2013-12-30 (×6): 600 mg via ORAL
  Filled 2013-12-28 (×7): qty 1

## 2013-12-28 MED ORDER — DILTIAZEM HCL ER COATED BEADS 120 MG PO CP24
120.0000 mg | ORAL_CAPSULE | Freq: Every day | ORAL | Status: DC
  Administered 2013-12-28 – 2013-12-30 (×3): 120 mg via ORAL
  Filled 2013-12-28 (×3): qty 1

## 2013-12-28 MED ORDER — DARIFENACIN HYDROBROMIDE ER 7.5 MG PO TB24
7.5000 mg | ORAL_TABLET | Freq: Every day | ORAL | Status: DC
  Administered 2013-12-28 – 2013-12-30 (×3): 7.5 mg via ORAL
  Filled 2013-12-28 (×3): qty 1

## 2013-12-28 MED ORDER — LEVOFLOXACIN 500 MG PO TABS
500.0000 mg | ORAL_TABLET | Freq: Every day | ORAL | Status: DC
  Administered 2013-12-28 – 2013-12-30 (×3): 500 mg via ORAL
  Filled 2013-12-28 (×3): qty 1

## 2013-12-28 MED ORDER — GABAPENTIN 300 MG PO CAPS
600.0000 mg | ORAL_CAPSULE | Freq: Three times a day (TID) | ORAL | Status: DC
  Administered 2013-12-28 – 2013-12-30 (×7): 600 mg via ORAL
  Filled 2013-12-28 (×9): qty 2

## 2013-12-28 MED ORDER — LORAZEPAM 1 MG PO TABS: 1.0000 mg | ORAL_TABLET | Freq: Three times a day (TID) | ORAL | Status: DC | PRN

## 2013-12-28 MED ORDER — MIRTAZAPINE 15 MG PO TABS
15.0000 mg | ORAL_TABLET | Freq: Every day | ORAL | Status: DC
  Administered 2013-12-28 – 2013-12-29 (×3): 15 mg via ORAL
  Filled 2013-12-28 (×4): qty 1

## 2013-12-28 MED ORDER — BISOPROLOL FUMARATE 5 MG PO TABS
5.0000 mg | ORAL_TABLET | Freq: Every day | ORAL | Status: DC
Start: 2013-12-28 — End: 2013-12-30
  Administered 2013-12-28 – 2013-12-30 (×3): 5 mg via ORAL
  Filled 2013-12-28 (×3): qty 1

## 2013-12-28 MED ORDER — ACETAMINOPHEN 325 MG PO TABS
650.0000 mg | ORAL_TABLET | Freq: Four times a day (QID) | ORAL | Status: DC | PRN
  Administered 2013-12-28 – 2013-12-29 (×2): 650 mg via ORAL
  Filled 2013-12-28 (×2): qty 2

## 2013-12-28 MED ORDER — PANTOPRAZOLE SODIUM 40 MG PO TBEC
40.0000 mg | DELAYED_RELEASE_TABLET | Freq: Every day | ORAL | Status: DC
  Administered 2013-12-28 – 2013-12-30 (×3): 40 mg via ORAL
  Filled 2013-12-28 (×3): qty 1

## 2013-12-28 MED ORDER — HYDROCODONE-ACETAMINOPHEN 5-325 MG PO TABS
1.0000 | ORAL_TABLET | ORAL | Status: DC | PRN
  Filled 2013-12-28: qty 1

## 2013-12-28 MED ORDER — PRAVASTATIN SODIUM 20 MG PO TABS
20.0000 mg | ORAL_TABLET | Freq: Every day | ORAL | Status: DC
  Administered 2013-12-28 – 2013-12-30 (×3): 20 mg via ORAL
  Filled 2013-12-28 (×3): qty 1

## 2013-12-28 MED ORDER — DOXYCYCLINE HYCLATE 100 MG PO TABS
100.0000 mg | ORAL_TABLET | Freq: Two times a day (BID) | ORAL | Status: DC
  Filled 2013-12-28: qty 1

## 2013-12-28 MED ORDER — ALBUTEROL SULFATE (2.5 MG/3ML) 0.083% IN NEBU
5.0000 mg | INHALATION_SOLUTION | RESPIRATORY_TRACT | Status: AC | PRN
  Administered 2013-12-28: 5 mg via RESPIRATORY_TRACT
  Filled 2013-12-28 (×2): qty 6

## 2013-12-28 MED ORDER — SODIUM CHLORIDE 0.9 % IJ SOLN
3.0000 mL | Freq: Two times a day (BID) | INTRAMUSCULAR | Status: DC
  Administered 2013-12-28 – 2013-12-29 (×3): 3 mL via INTRAVENOUS

## 2013-12-28 MED ORDER — ONDANSETRON HCL 4 MG/2ML IJ SOLN: 4.0000 mg | Freq: Four times a day (QID) | INTRAMUSCULAR | Status: DC | PRN

## 2013-12-28 NOTE — ED Notes (Signed)
Dr. Magdalene Patriciaatova at the bedside.

## 2013-12-28 NOTE — Progress Notes (Signed)
Patient admitted after midnight- please see H&P.   COPD exacerbation - -  -Steroid, antibiotics, Albuterol PRN, scheduled Atrovent, solmeterol and Mucinex. Titrate O2 to saturation >90%. Follow patients respiratory status. Spoke about quitting smoking, change metoprolol to bisoprolol Chronic diastolic heart failure, NYHA class 1 - stable currently does not appear to be in fluid overload, obtain ECHO Chronic respiratory failure - acute on chronic respiratory failure due to COPD exacerbation  Tobacco abuse - nicotine patch, nursing consult, spoke to patient at length Chest pressure - - given risk factors will admit, monitor on telemetry, cycle cardiac enzymes, obtain serial ECG. Further risk stratify with lipid panel, hgA1C, obtain TSH. Make sure patient is on Aspirin. Further treatment based on the currently pending results.  Hypertension - continue Cardizem , change metoprolol to bisoprolol UTI- levaquin  Carolyn CanaryJessica Lerin Jech DO

## 2013-12-28 NOTE — H&P (Signed)
PCP:  Eartha Inch, MD  Pulmonology [facility]  Chief Complaint:  Shortness of breath, chest pressure  HPI: Carolyn [name] is a 73 y.o. female   has a past medical history of Hypertension; COPD (chronic obstructive pulmonary disease); Anemia; Headaches, cluster; Wears glasses; CVA (cerebral vascular accident) (01/2010); Complication of anesthesia; PONV (postoperative nausea and vomiting); Shortness of breath; Arthritis; and Hypercholesteremia.   Presented with  Patient has hx of COPD requiring 3 L of oxygen at baseline. Continues to smoke. 4 days hx of worsening shortness of breath, cough productive of white sputum. She describes worsening chest pressure. patient states she is having a panic attack. Patient continues to wheeze she states that she wheezes at baseline. She denies any current chest pressure. CXR showed no infiltrate but evidence of COPD  Hospitalist was called for admission forCOPD exacerbation and chest pressure  Review of Systems:    Pertinent positives include: chest pain, shortness of breath at rest. dyspnea on exertion,  productive cough,   Constitutional:  No weight loss, night sweats, Fevers, chills, fatigue, weight loss  HEENT:  No headaches, Difficulty swallowing,Tooth/dental problems,Sore throat,  No sneezing, itching, ear ache, nasal congestion, post nasal drip,  Cardio-vascular:  No Orthopnea, PND, anasarca, dizziness, palpitations.no Bilateral lower extremity swelling  GI:  No heartburn, indigestion, abdominal pain, nausea, vomiting, diarrhea, change in bowel habits, loss of appetite, melena, blood in stool, hematemesis Resp:    No excess mucus, No non-productive cough, No coughing up of blood. No change in color of mucus.No wheezing. Skin:  no rash or lesions. No jaundice GU:  no dysuria, change in color of urine, no urgency or frequency. No straining to urinate.  No flank pain.  Musculoskeletal:  No joint pain or no joint swelling. No  decreased range of motion. No back pain.  Psych:  No change in mood or affect. No depression or anxiety. No memory loss.  Neuro: no localizing neurological complaints, no tingling, no weakness, no double vision, no gait abnormality, no slurred speech, no confusion  Otherwise ROS are negative except for above, 10 systems were reviewed  Past Medical History: Past Medical History  Diagnosis Date  . Hypertension   . COPD (chronic obstructive pulmonary disease)   . Anemia   . Headaches, cluster   . Wears glasses   . CVA (cerebral vascular accident) 01/2010  . Complication of anesthesia     " trouble waking up "  . PONV (postoperative nausea and vomiting)   . Shortness of breath   . Arthritis   . Hypercholesteremia    Past Surgical History  Procedure Laterality Date  . Abdominal hysterectomy    . Varicose vein surgery    . Left hand    . Cholecystectomy  07/20/10    Dr Andrey Campanile; Lap chole with ioc  . Hip pinning,cannulated Left 11/2012    Dr Rennis Chris  . Hand surgery Left     BALL JOINT  . Hip pinning,cannulated Left 11/18/2012    Procedure: CANNULATED HIP PINNING;  Surgeon: Senaida Lange, MD;  Location: [facility] OR;  Service: Orthopedics;  Laterality: Left;     Medications: Prior to Admission medications   Medication Sig Start Date End Date Taking? Authorizing Provider  acetaminophen (TYLENOL) 325 MG tablet Take 650 mg by mouth 3 (three) times daily as needed. For pain   Yes Historical Provider, MD  albuterol (PROVENTIL HFA;VENTOLIN HFA) 108 (90 BASE) MCG/ACT inhaler Inhale 2 puffs into the lungs every 4 (four) hours as needed.  For shortness of breath   Yes Historical Provider, MD  aspirin EC 81 MG tablet Take 81 mg by mouth daily.   Yes Historical Provider, MD  budesonide-formoterol (SYMBICORT) 160-4.5 MCG/ACT inhaler Inhale 2 puffs into the lungs 2 (two) times daily.   Yes Historical Provider, MD  clopidogrel (PLAVIX) 75 MG tablet Take 75 mg by mouth at bedtime.   Yes Historical  Provider, MD  diltiazem (CARDIZEM CD) 120 MG 24 hr capsule Take 1 capsule (120 mg total) by mouth daily. 12/28/12  Yes Vanetta MuldersScott Zackowski, MD  gabapentin (NEURONTIN) 300 MG capsule Take 600 mg by mouth 3 (three) times daily.   Yes Historical Provider, MD  HYDROcodone-acetaminophen (NORCO) 5-325 MG per tablet Take 1-2 tablets by mouth every 4 (four) hours as needed. 11/20/12  Yes Tracy Shuford, PA-C  Lactobacillus (ACIDOPHILUS EXTRA STRENGTH PO) Take 1 tablet by mouth daily.   Yes Historical Provider, MD  LORazepam (ATIVAN) 1 MG tablet Take 1 mg by mouth every 8 (eight) hours as needed for anxiety.   Yes Historical Provider, MD  magnesium oxide (MAG-OX) 400 MG tablet Take 400 mg by mouth 2 (two) times daily.   Yes Historical Provider, MD  metoprolol succinate (TOPROL-XL) 25 MG 24 hr tablet Take 25 mg by mouth daily.   Yes Historical Provider, MD  mirtazapine (REMERON) 15 MG tablet Take 15 mg by mouth at bedtime.   Yes Historical Provider, MD  ondansetron (ZOFRAN) 4 MG tablet Take 4 mg by mouth every 6 (six) hours as needed. For nausea   Yes Historical Provider, MD  pravastatin (PRAVACHOL) 20 MG tablet Take 20 mg by mouth daily.   Yes Historical Provider, MD  solifenacin (VESICARE) 5 MG tablet Take 5 mg by mouth daily.   Yes Historical Provider, MD  aspirin EC 325 MG tablet Take 1 tablet (325 mg total) by mouth daily. 11/20/12   French Anaracy Shuford, PA-C  ciprofloxacin (CIPRO) 500 MG tablet Take 1 tablet (500 mg total) by mouth 2 (two) times daily. 12/28/12   Vanetta MuldersScott Zackowski, MD  polyethylene glycol (MIRALAX / GLYCOLAX) packet Take 17 g by mouth daily. 11/20/12   Alba CoryBelkys A Regalado, MD    Allergies:   Allergies  Allergen Reactions  . Penicillins Anaphylaxis    Anaphylaxis  . Codeine Rash    Reaction unknown    Social History:  Ambulatory  independently   Lives at home alone,         reports that she has been smoking Cigarettes.  She has a 3 pack-year smoking history. She has never used smokeless  tobacco. She reports that she does not drink alcohol or use illicit drugs.    Family History: family history includes Cancer in her maternal aunt; Diabetes in her father; Heart disease in her father and mother; Hypertension in her mother; Lung disease in her father.    Physical Exam: Patient Vitals for the past 24 hrs:  BP Temp Temp src Pulse Resp SpO2  12/28/13 0000 111/75 mmHg - - 88 25 98 %  12/27/13 2330 126/65 mmHg - - 88 (!) 30 91 %  12/27/13 2245 (!) 117/45 mmHg - - 92 (!) 27 90 %  12/27/13 2200 (!) 111/50 mmHg - - 79 (!) 27 94 %  12/27/13 2130 132/61 mmHg - - 78 (!) 31 92 %  12/27/13 2100 130/56 mmHg - - 79 (!) 27 96 %  12/27/13 2045 129/71 mmHg - - 80 (!) 27 98 %  12/27/13 2030 141/63 mmHg - - 85  18 100 %  12/27/13 2015 - - - 78 (!) 27 99 %  12/27/13 2000 97/74 mmHg - - - (!) 28 -  12/27/13 1915 129/70 mmHg - - - 19 -  12/27/13 1859 133/77 mmHg 98.2 F (36.8 C) Oral 91 22 100 %    1. General:  in No Acute distress, older apearing 2. Psychological: Alert and   Oriented 3. Head/ENT:   Moist  Mucous Membranes                          Head Non traumatic, neck supple                          Normal   Dentition 4. SKIN: normal  Skin turgor,  Skin clean Dry and intact no rash 5. Heart: Regular rate and rhythm no Murmur, Rub or gallop 6. Lungs: some wheezes no crackles  Distant breath sounds 7. Abdomen: Soft, non-tender, Non distended 8. Lower extremities: no clubbing, cyanosis, or edema 9. Neurologically Grossly intact, moving all 4 extremities equally 10. MSK: Normal range of motion  body mass index is unknown because there is no weight on file.   Labs on Admission:   Results for orders placed or performed during the hospital encounter of 12/27/13 (from the past 24 hour(s))  CBC with Differential     Status: Abnormal   Collection Time: 12/27/13  7:37 PM  Result Value Ref Range   WBC 7.5 4.0 - 10.5 K/uL   RBC 3.84 (L) 3.87 - 5.11 MIL/uL   Hemoglobin 11.6 (L) 12.0 -  15.0 g/dL   HCT 25.337.4 66.436.0 - 40.346.0 %   MCV 97.4 78.0 - 100.0 fL   MCH 30.2 26.0 - 34.0 pg   MCHC 31.0 30.0 - 36.0 g/dL   RDW 47.413.0 25.911.5 - 56.315.5 %   Platelets 234 150 - 400 K/uL   Neutrophils Relative % 64 43 - 77 %   Neutro Abs 4.9 1.7 - 7.7 K/uL   Lymphocytes Relative 26 12 - 46 %   Lymphs Abs 2.0 0.7 - 4.0 K/uL   Monocytes Relative 6 3 - 12 %   Monocytes Absolute 0.4 0.1 - 1.0 K/uL   Eosinophils Relative 3 0 - 5 %   Eosinophils Absolute 0.2 0.0 - 0.7 K/uL   Basophils Relative 1 0 - 1 %   Basophils Absolute 0.1 0.0 - 0.1 K/uL  Basic metabolic panel     Status: Abnormal   Collection Time: 12/27/13  7:37 PM  Result Value Ref Range   Sodium 141 137 - 147 mEq/L   Potassium 4.7 3.7 - 5.3 mEq/L   Chloride 100 96 - 112 mEq/L   CO2 33 (H) 19 - 32 mEq/L   Glucose, Bld 97 70 - 99 mg/dL   BUN 19 6 - 23 mg/dL   Creatinine, Ser 8.750.83 0.50 - 1.10 mg/dL   Calcium 9.9 8.4 - 64.310.5 mg/dL   GFR calc non Af Amer 69 (L) >90 mL/min   GFR calc Af Amer 80 (L) >90 mL/min   Anion gap 8 5 - 15  Pro b natriuretic peptide (BNP)     Status: None   Collection Time: 12/27/13  7:37 PM  Result Value Ref Range   Pro B Natriuretic peptide (BNP) 83.5 0 - 125 pg/mL  I-stat troponin, ED     Status: None   Collection Time: 12/27/13  7:48 PM  Result Value Ref Range   Troponin i, poc 0.00 0.00 - 0.08 ng/mL   Comment 3          Troponin I     Status: None   Collection Time: 12/27/13 10:41 PM  Result Value Ref Range   Troponin I <0.30 <0.30 ng/mL    UA not obtained  Lab Results  Component Value Date   HGBA1C * 01/25/2010    5.7 (NOTE)                                                                       According to the ADA Clinical Practice Recommendations for 2011, when HbA1c is used as a screening test:   >=6.5%   Diagnostic of Diabetes Mellitus           (if abnormal result  is confirmed)  5.7-6.4%   Increased risk of developing Diabetes Mellitus  References:Diagnosis and Classification of Diabetes  [place],Diabetes Care,2011,34(Suppl 1):S62-S69 and Standards of Medical Care in         Diabetes - 2011,Diabetes Care,2011,34  (Suppl 1):S11-S61.    CrCl cannot be calculated (Unknown ideal weight.).  BNP (last 3 results)  Recent Labs  12/27/13 1937  PROBNP 83.5    Other results:  I have pearsonaly reviewed this: ECG REPORT  Rate:94  Rhythm: NSR peaked t wave in lateral leads ST&T Change: no ischemic chagnes  There were no vitals filed for this visit.   Cultures:    Component Value Date/Time   SDES URINE, CLEAN CATCH 12/28/2012 1938   SPECREQUEST NONE 12/28/2012 1938   CULT  12/28/2012 1938    Multiple bacterial morphotypes present, none predominant. Suggest appropriate recollection if clinically indicated. Performed at [facility]   REPTSTATUS 01/02/2013 FINAL 12/28/2012 1938     Radiological Exams on Admission: Dg Chest Port 1 View  12/27/2013   CLINICAL DATA:  Initial evaluation for chest pain. Shortness of breath.  EXAM: PORTABLE CHEST - 1 VIEW  COMPARISON:  Prior study from 12/28/2012  FINDINGS: Patient is rotated to the right. Allowing for rotation, cardiac and mediastinal silhouettes are grossly stable. Delete that  Lungs are normally inflated. Emphysematous changes noted No focal infiltrate, pulmonary edema, or pleural effusion. No pneumothorax.  Diffuse osteopenia present.  No acute osseus abnormality.  IMPRESSION: 1. No active cardiopulmonary disease. 2. Emphysema.   Electronically Signed   By: Rise Mu M.D.   On: 12/27/2013 21:18    Chart has been reviewed  Assessment/Plan  73 yo F with hx of Diastolic CHF, COPD with on going tobacco abuse  Here with COPD exacerbation and chest pressure  Present on Admission:  . COPD exacerbation -  - Will initiate Steroid taper, antibiotics, Albuterol PRN, scheduled Atrovent, solmeterol and Mucinex. Titrate O2 to saturation >90%. Follow patients respiratory status. Spoke about quitting smoking, change  metoprolol to bisoprolol . Chronic diastolic heart failure, NYHA class 1 - stable currently does not appear to be in fluid overload, obtain ECHO . Chronic respiratory failure - acute on chronic respiratory failure due to COPD exacerbation . Tobacco abuse - nicotine patch, nursing consult, spoke to patient at length . Chest pressure - - given risk factors will admit, monitor on telemetry, cycle cardiac enzymes, obtain  serial ECG. Further risk stratify with lipid panel, hgA1C, obtain TSH. Make sure patient is on Aspirin. Further treatment based on the currently pending results.  . Hypertension - continue Cardizem , change metoprolol to bisoprolol.   Prophylaxis:  Lovenox, Protonix  CODE STATUS:   DNR/DNI as per patient   Other plan as per orders.  I have spent a total of 55 min on this admission  Adreena Willits 12/28/2013, 12:27 AM  [facility]  Pager [phone]   after 2 AM please page floor coverage PA If 7AM-7PM, please contact the day team taking care of the patient  [email or web address]  Password [email or web address]

## 2013-12-28 NOTE — Progress Notes (Signed)
  Echocardiogram 2D Echocardiogram has been performed.  Aris EvertsRix, Sherree Shankman A 12/28/2013, 1:57 PM

## 2013-12-29 DIAGNOSIS — Z720 Tobacco use: Secondary | ICD-10-CM

## 2013-12-29 DIAGNOSIS — J9611 Chronic respiratory failure with hypoxia: Secondary | ICD-10-CM

## 2013-12-29 LAB — URINE CULTURE

## 2013-12-29 MED ORDER — PREDNISONE 10 MG PO TABS: ORAL_TABLET | ORAL | Status: DC

## 2013-12-29 MED ORDER — GUAIFENESIN ER 600 MG PO TB12
600.0000 mg | ORAL_TABLET | Freq: Two times a day (BID) | ORAL | Status: DC
Start: 2013-12-29 — End: 2015-06-17

## 2013-12-29 MED ORDER — PREDNISONE 50 MG PO TABS
50.0000 mg | ORAL_TABLET | Freq: Every day | ORAL | Status: DC
  Administered 2013-12-29 – 2013-12-30 (×2): 50 mg via ORAL
  Filled 2013-12-29 (×3): qty 1

## 2013-12-29 MED ORDER — ALBUTEROL SULFATE (2.5 MG/3ML) 0.083% IN NEBU: 2.5000 mg | INHALATION_SOLUTION | RESPIRATORY_TRACT | Status: DC | PRN

## 2013-12-29 MED ORDER — DSS 100 MG PO CAPS: 100.0000 mg | ORAL_CAPSULE | Freq: Two times a day (BID) | ORAL | Status: DC

## 2013-12-29 MED ORDER — NICOTINE 21 MG/24HR TD PT24: 21.0000 mg | MEDICATED_PATCH | Freq: Every day | TRANSDERMAL | Status: DC

## 2013-12-29 NOTE — Social Work (Signed)
CSW spoke with facility, [facility], who stated that they will not be able to do an admission until Monday 12/21 due to medication needs.  Patient is anticipated for discharge on 12/21 in order to accommodate medication needs.  Patient will need FL-2 at discharge for facility to re-admit patient.  CSW will continue to follow until discharge.  Beverly Sessionsywan J Leslyn Monda MSW, LCSW

## 2013-12-29 NOTE — Discharge Summary (Addendum)
Physician Discharge Summary  Carolyn [name] ZOX:[id number] DOB: 02/01/1940 DOA: 12/27/2013  PCP: Eartha InchBADGER,MICHAEL C, MD  Admit date: 12/27/2013 Discharge date: 12/30/2013  Time spent: 35 minutes  Recommendations for Outpatient Follow-up:  Needs to stop smoking Continue home O2 cipro as before- end date 12/25  Discharge Diagnoses:  Active Problems:   Tobacco abuse   Hypertension   Chronic diastolic heart failure, NYHA class 1   Chronic respiratory failure   COPD exacerbation   Chest pressure   Chest pain   Discharge Condition: stable  Diet recommendation: cardiac  Filed Weights   12/28/13 0112  Weight: 76.8 kg (169 lb 5 oz)    History of present illness:  Carolyn [name] is a 73 y.o. female   has a past medical history of Hypertension; COPD (chronic obstructive pulmonary disease); Anemia; Headaches, cluster; Wears glasses; CVA (cerebral vascular accident) (01/2010); Complication of anesthesia; PONV (postoperative nausea and vomiting); Shortness of breath; Arthritis; and Hypercholesteremia.   Presented with  Patient has hx of COPD requiring 3 L of oxygen at baseline. Continues to smoke. 4 days hx of worsening shortness of breath, cough productive of white sputum. She describes worsening chest pressure. patient states she is having a panic attack. Patient continues to wheeze she states that she wheezes at baseline. She denies any current chest pressure. CXR showed no infiltrate but evidence of COPD  Hospitalist was called for admission forCOPD exacerbation and chest pressure  Hospital Course:  COPD exacerbation - -  -Steroid po taper - antibiotics -Mucinex.  -continue O2 Spoke about quitting smoking  Chronic diastolic heart failure, NYHA class 1 - stable currently does not appear to be in fluid overload, ECHO ok  Chronic respiratory failure - acute on chronic respiratory failure due to COPD exacerbation Tobacco abuse - nicotine patch, nursing consult, spoke to patient  at length Chest pressure - resolved Hypertension - continue Cardizem  UTI- resume cipro until 12/25  Procedures:  Echo: Study Conclusions  - Left ventricle: The cavity size was normal. Wall thickness was normal. Systolic function was normal. The estimated ejection fraction was in the range of 60% to 65%. Doppler parameters are consistent with abnormal left ventricular relaxation (grade 1 diastolic dysfunction). The E/e&' ratio is between 8-15, suggesting indeterminate LV filling pressure. - Left atrium: The atrium was normal in size.  Impressions:  - LVEF 60-65%, normal wall thickness, inadequate for wall motion abnormalities, diastolic dysfunction, indetermant LV filling pressure.  Consultations:  none  Discharge Exam: Filed Vitals:   12/30/13 0447  BP: 116/52  Pulse: 70  Temp: 97.8 F (36.6 C)  Resp: 18    General: A+Ox3, NAD- anxious to go home Cardiovascular: rrr Respiratory: decreased b/l  Discharge Instructions You were cared for by a hospitalist during your hospital stay. If you have any questions about your discharge medications or the care you received while you were in the hospital after you are discharged, you can call the unit and asked to speak with the hospitalist on call if the hospitalist that took care of you is not available. Once you are discharged, your primary care physician will handle any further medical issues. Please note that NO REFILLS for any discharge medications will be authorized once you are discharged, as it is imperative that you return to your primary care physician (or establish a relationship with a primary care physician if you do not have one) for your aftercare needs so that they can reassess your need for medications and monitor your lab values.  Discharge Instructions    Diet - low sodium heart healthy    Complete by:  As directed      Discharge instructions    Complete by:  As directed   From ALF Continue home  O2     Increase activity slowly    Complete by:  As directed           Current Discharge Medication List    START taking these medications   Details  docusate sodium 100 MG CAPS Take 100 mg by mouth 2 (two) times daily. Qty: 10 capsule, Refills: 0    guaiFENesin (MUCINEX) 600 MG 12 hr tablet Take 1 tablet (600 mg total) by mouth 2 (two) times daily. Qty: 14 tablet, Refills: 0    nicotine (NICODERM CQ - DOSED IN MG/24 HOURS) 21 mg/24hr patch Place 1 patch (21 mg total) onto the skin daily. Qty: 28 patch, Refills: 0    predniSONE (DELTASONE) 10 MG tablet 50 mg x 2 days, 40 mg x 2 days, 30 mg x 2 days, 20 mg x 2 days, 10 mg x 2 days then d/c Qty: 30 tablet, Refills: 0      CONTINUE these medications which have CHANGED   Details  HYDROcodone-acetaminophen (NORCO) 5-325 MG per tablet Take 1 tablet by mouth every 4 (four) hours as needed. Qty: 50 tablet, Refills: 0      CONTINUE these medications which have NOT CHANGED   Details  acetaminophen (TYLENOL) 325 MG tablet Take 650 mg by mouth 3 (three) times daily as needed. For pain    albuterol (PROVENTIL HFA;VENTOLIN HFA) 108 (90 BASE) MCG/ACT inhaler Inhale 2 puffs into the lungs every 4 (four) hours as needed. For shortness of breath    aspirin EC 81 MG tablet Take 81 mg by mouth daily.    budesonide-formoterol (SYMBICORT) 160-4.5 MCG/ACT inhaler Inhale 2 puffs into the lungs 2 (two) times daily.    clopidogrel (PLAVIX) 75 MG tablet Take 75 mg by mouth at bedtime.    diltiazem (CARDIZEM CD) 120 MG 24 hr capsule Take 1 capsule (120 mg total) by mouth daily. Qty: 14 capsule, Refills: 2    gabapentin (NEURONTIN) 300 MG capsule Take 600 mg by mouth 3 (three) times daily.    Lactobacillus (ACIDOPHILUS EXTRA STRENGTH PO) Take 1 tablet by mouth daily.    LORazepam (ATIVAN) 1 MG tablet Take 1 mg by mouth every 8 (eight) hours as needed for anxiety.    magnesium oxide (MAG-OX) 400 MG tablet Take 400 mg by mouth 2 (two) times  daily.    metoprolol succinate (TOPROL-XL) 25 MG 24 hr tablet Take 25 mg by mouth daily.    mirtazapine (REMERON) 15 MG tablet Take 15 mg by mouth at bedtime.    ondansetron (ZOFRAN) 4 MG tablet Take 4 mg by mouth every 6 (six) hours as needed. For nausea    pravastatin (PRAVACHOL) 20 MG tablet Take 20 mg by mouth daily.    solifenacin (VESICARE) 5 MG tablet Take 5 mg by mouth daily.    ciprofloxacin (CIPRO) 500 MG tablet Take 1 tablet (500 mg total) by mouth 2 (two) times daily. Qty: 10 tablet, Refills: 0    polyethylene glycol (MIRALAX / GLYCOLAX) packet Take 17 g by mouth daily. Qty: 14 each, Refills: 0       Allergies  Allergen Reactions  . Penicillins Anaphylaxis    Anaphylaxis  . Codeine Rash    Reaction unknown   Follow-up Information    Follow  up with Eartha InchBADGER,MICHAEL C, MD In 1 week.   Specialty:  Family Medicine   Contact information:   [address] [place] [address] [phone]        The results of significant diagnostics from this hospitalization (including imaging, microbiology, ancillary and laboratory) are listed below for reference.    Significant Diagnostic Studies: Dg Chest Port 1 View  12/27/2013   CLINICAL DATA:  Initial evaluation for chest pain. Shortness of breath.  EXAM: PORTABLE CHEST - 1 VIEW  COMPARISON:  Prior study from 12/28/2012  FINDINGS: Patient is rotated to the right. Allowing for rotation, cardiac and mediastinal silhouettes are grossly stable. Delete that  Lungs are normally inflated. Emphysematous changes noted No focal infiltrate, pulmonary edema, or pleural effusion. No pneumothorax.  Diffuse osteopenia present.  No acute osseus abnormality.  IMPRESSION: 1. No active cardiopulmonary disease. 2. Emphysema.   Electronically Signed   By: Rise MuBenjamin  McClintock M.D.   On: 12/27/2013 21:18    Microbiology: Recent Results (from the past 240 hour(s))  Culture, Urine     Status: None   Collection Time: 12/28/13  9:23 AM   Result Value Ref Range Status   Specimen Description URINE, CLEAN CATCH  Final   Special Requests NONE  Final   Culture  Setup Time   Final    12/28/2013 14:50 Performed at [facility]    Colony Count   Final    >=100,000 COLONIES/ML Performed at [facility]    Culture   Final    Multiple bacterial morphotypes present, none predominant. Suggest appropriate recollection if clinically indicated. Performed at [facility]    Report Status 12/29/2013 FINAL  Final     Labs: Basic Metabolic Panel:  Recent Labs Lab 12/27/13 1937 12/28/13 0235  NA 141 140  K 4.7 4.3  CL 100 100  CO2 33* 33*  GLUCOSE 97 147*  BUN 19 21  CREATININE 0.83 0.95  CALCIUM 9.9 9.8  MG  --  2.2  PHOS  --  4.0   Liver Function Tests:  Recent Labs Lab 12/28/13 0235  AST 13  ALT 10  ALKPHOS 81  BILITOT 0.2*  PROT 6.2  ALBUMIN 3.5   No results for input(s): LIPASE, AMYLASE in the last 168 hours. No results for input(s): AMMONIA in the last 168 hours. CBC:  Recent Labs Lab 12/27/13 1937 12/28/13 0235  WBC 7.5 10.8*  NEUTROABS 4.9  --   HGB 11.6* 11.3*  HCT 37.4 36.4  MCV 97.4 98.4  PLT 234 232   Cardiac Enzymes:  Recent Labs Lab 12/27/13 2241 12/28/13 0235 12/28/13 0911 12/28/13 1444  TROPONINI <0.30 <0.30 <0.30 <0.30   BNP: BNP (last 3 results)  Recent Labs  12/27/13 1937  PROBNP 83.5   CBG: No results for input(s): GLUCAP in the last 168 hours.     SignedMarlin Canary:  Shawneequa Baldridge  [facility] 12/30/2013, 11:44 AM

## 2013-12-30 MED ORDER — CIPROFLOXACIN HCL 500 MG PO TABS: 500.0000 mg | ORAL_TABLET | Freq: Two times a day (BID) | ORAL | Status: DC

## 2013-12-30 MED ORDER — HYDROCODONE-ACETAMINOPHEN 5-325 MG PO TABS: 1.0000 | ORAL_TABLET | ORAL | Status: DC | PRN

## 2013-12-30 NOTE — Clinical Social Work Note (Signed)
Patient to be d/c'ed today to [facility].  Patient and family agreeable to plans will transport via ems.  Patient and her daughter have been informed.    Windell MouldingEric Akila Batta, MSW, Theresia MajorsLCSWA [phone]

## 2013-12-30 NOTE — Progress Notes (Signed)
No overnight events.  Patient resting.  Please see d/c summary from yesterday. Carolyn CanaryJessica Vann DO

## 2014-07-27 IMAGING — CR DG CHEST 1V PORT
1 series · 1 of 1 positions shown · non-contrast
Comparison: 09/21/2012

CLINICAL DATA: Preop

EXAM:
PORTABLE CHEST - 1 VIEW

[AP]
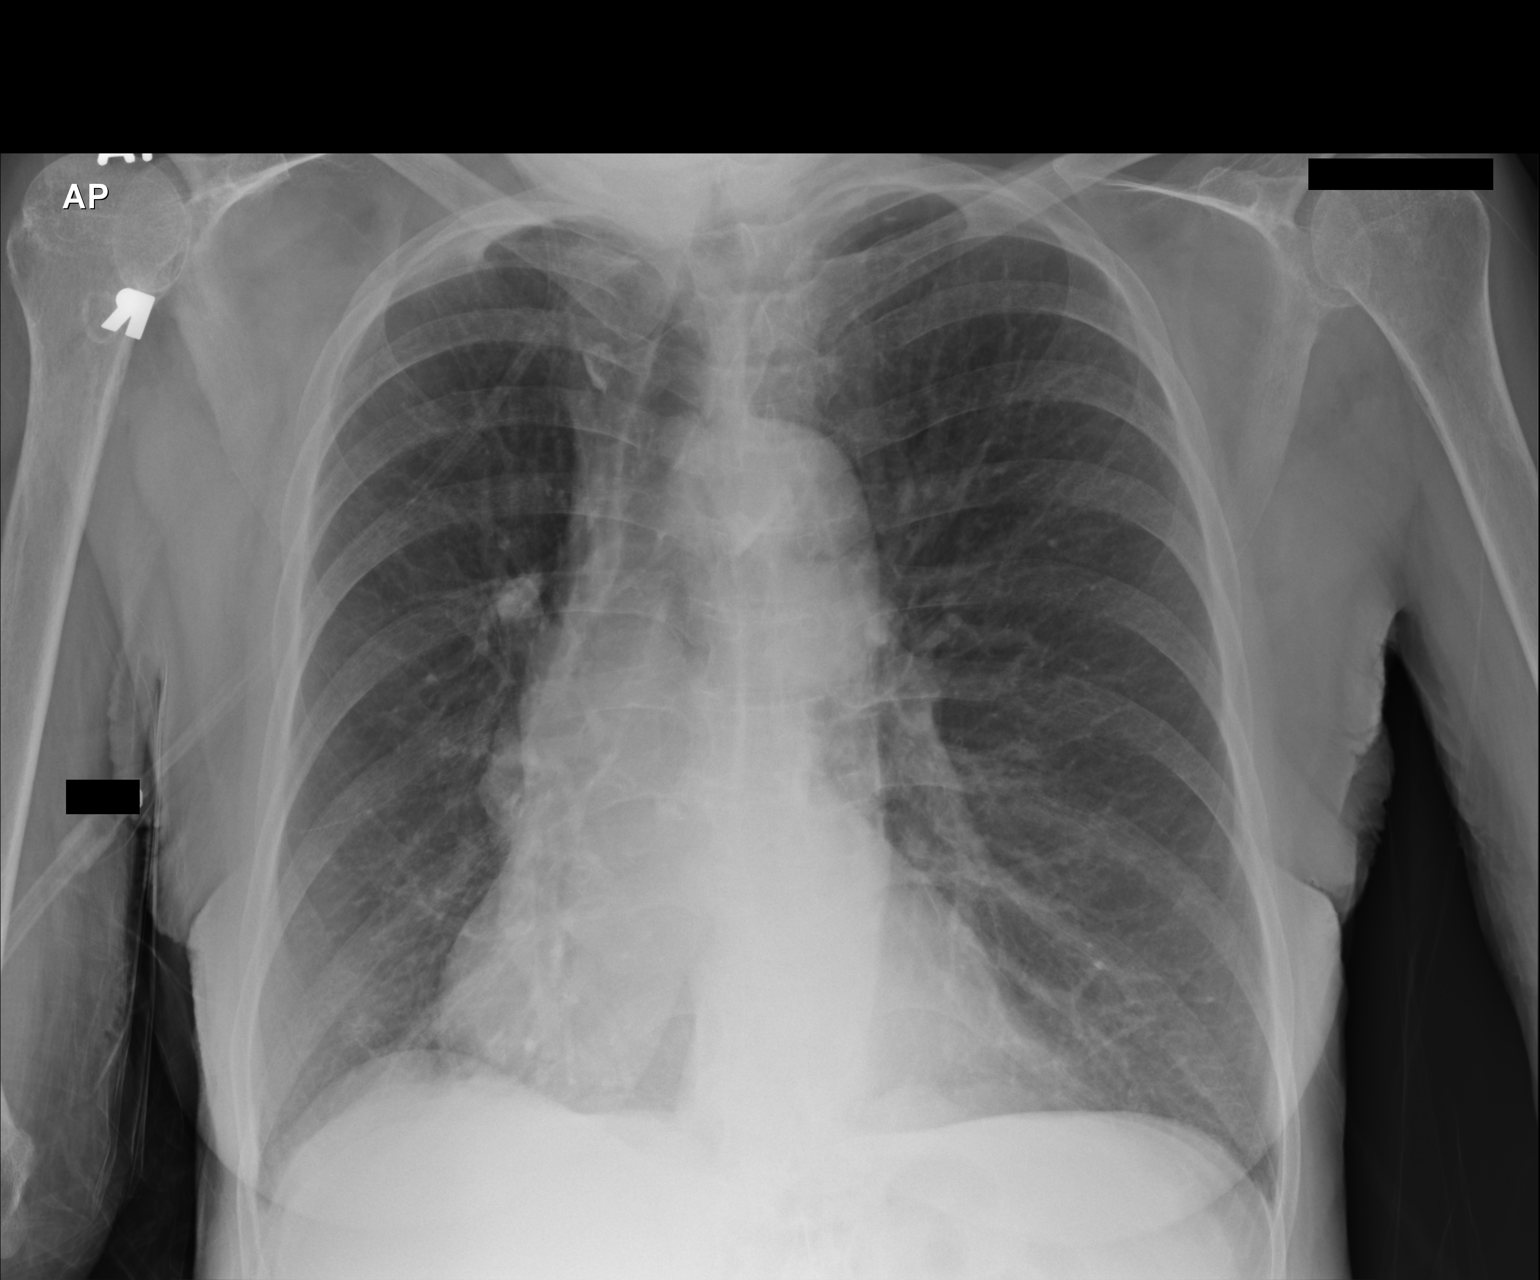

[1 of 1 positions shown; findings below may reference images not displayed]

FINDINGS: Cardiomediastinal silhouette is stable. Hyperinflation again noted
No acute infiltrate or pleural effusion. No pulmonary edema.
IMPRESSION: No active disease. Hyperinflation again noted.

## 2014-07-27 IMAGING — CR DG HIP (WITH OR WITHOUT PELVIS) 2-3V*L*
3 series · 3 of 3 positions shown · non-contrast
Comparison: None.

CLINICAL DATA: Fall

EXAM:
LEFT HIP - COMPLETE 2+ VIEW

[t pelvis ap]
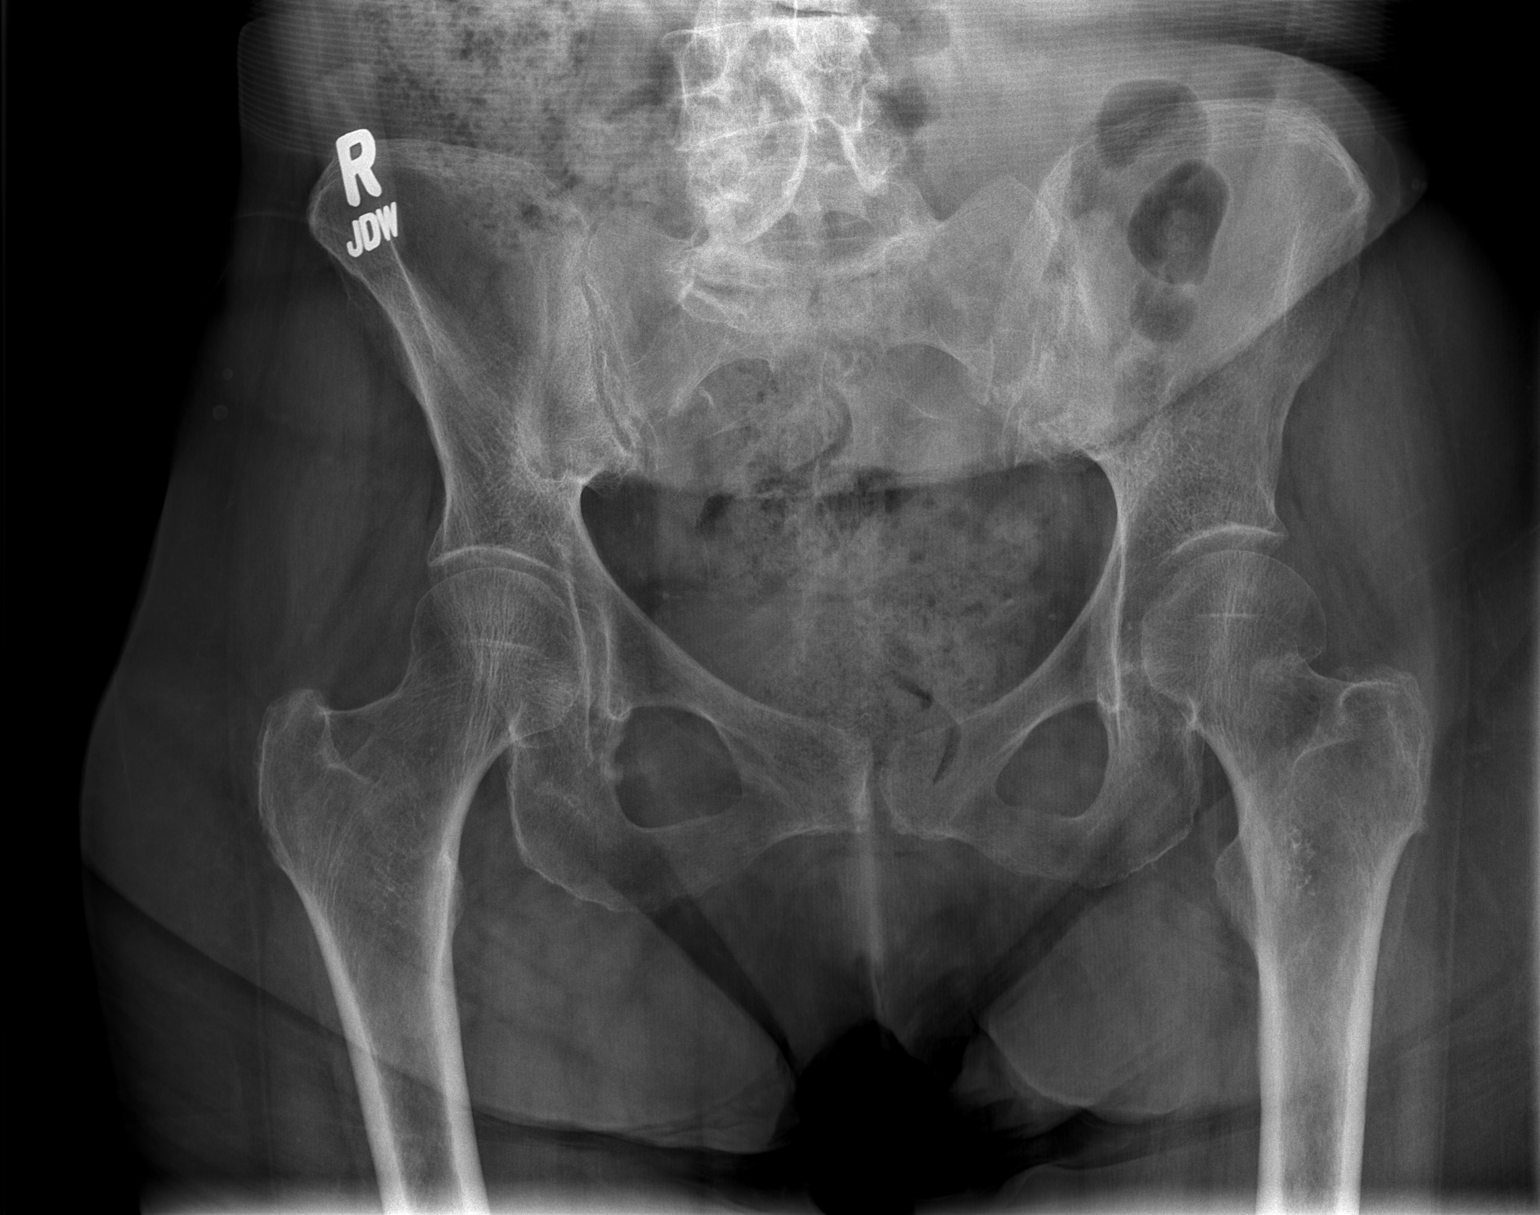

[t hip ap left]
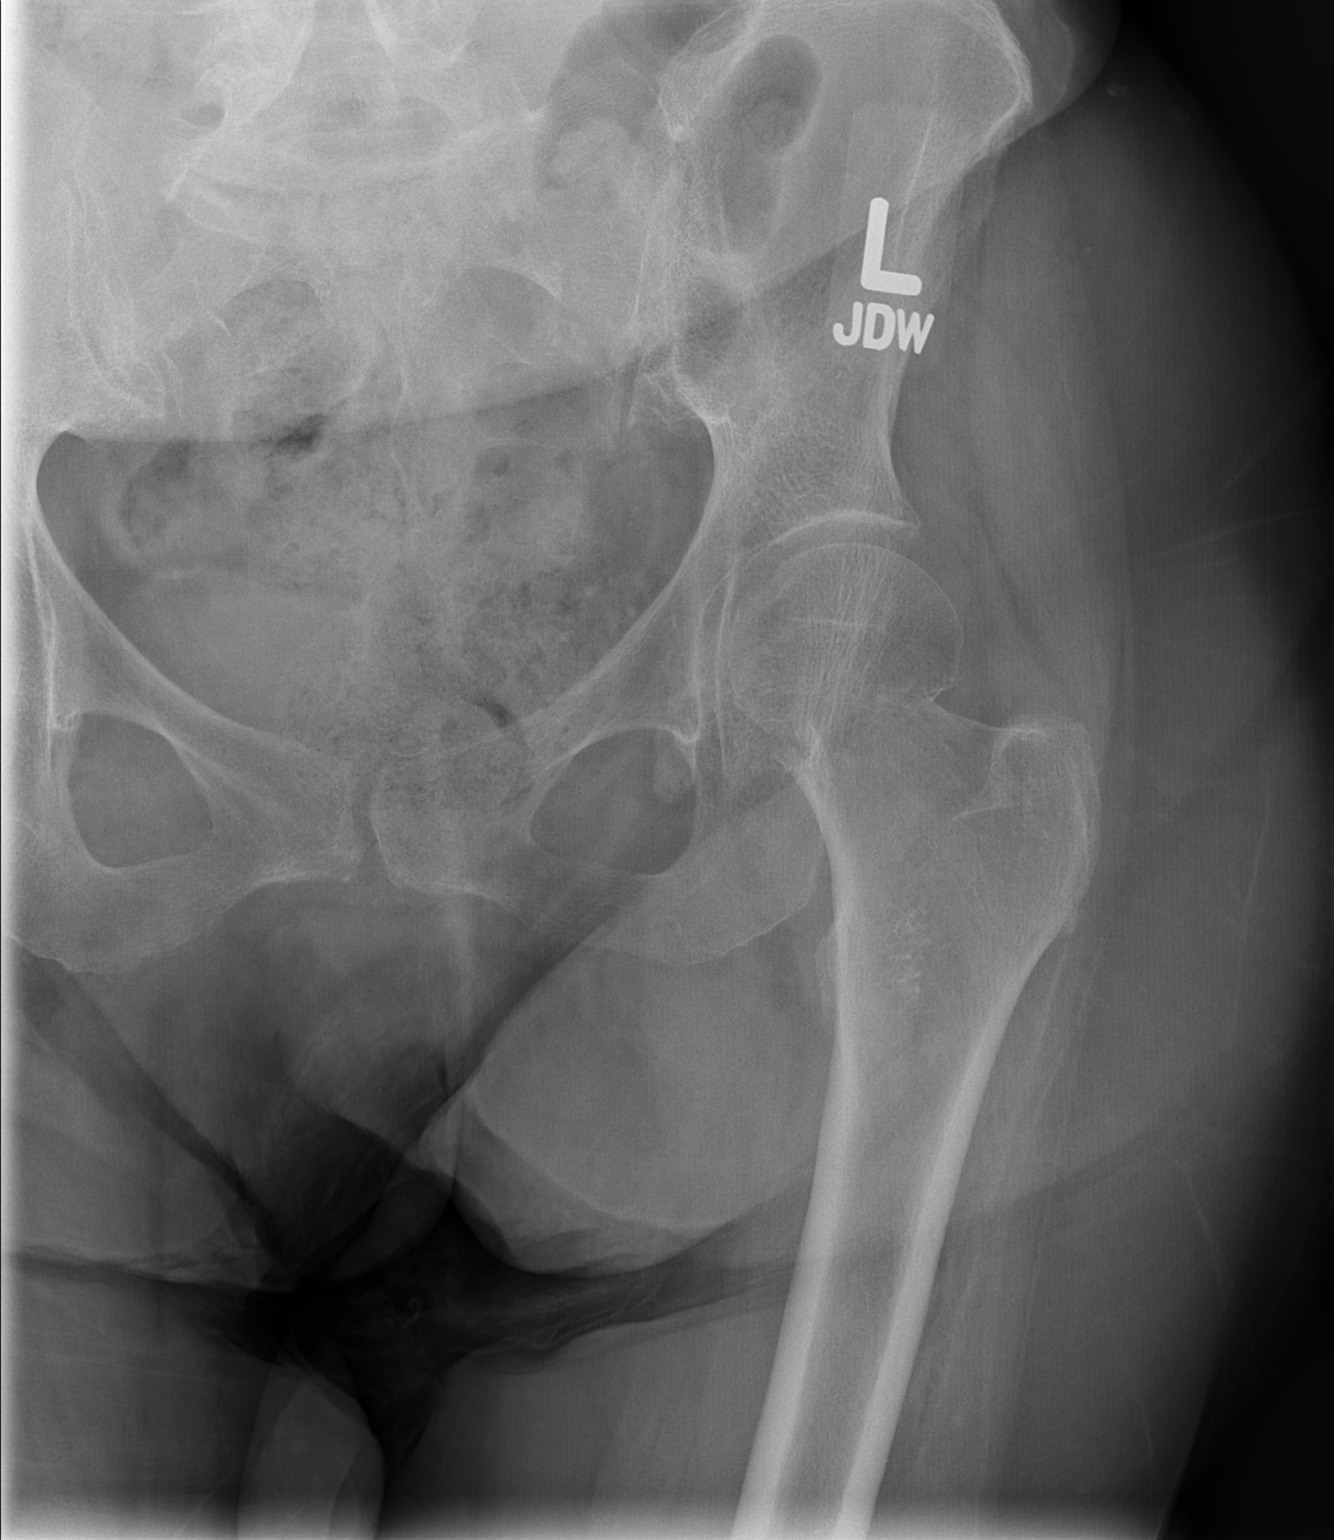

[x hip lat left]
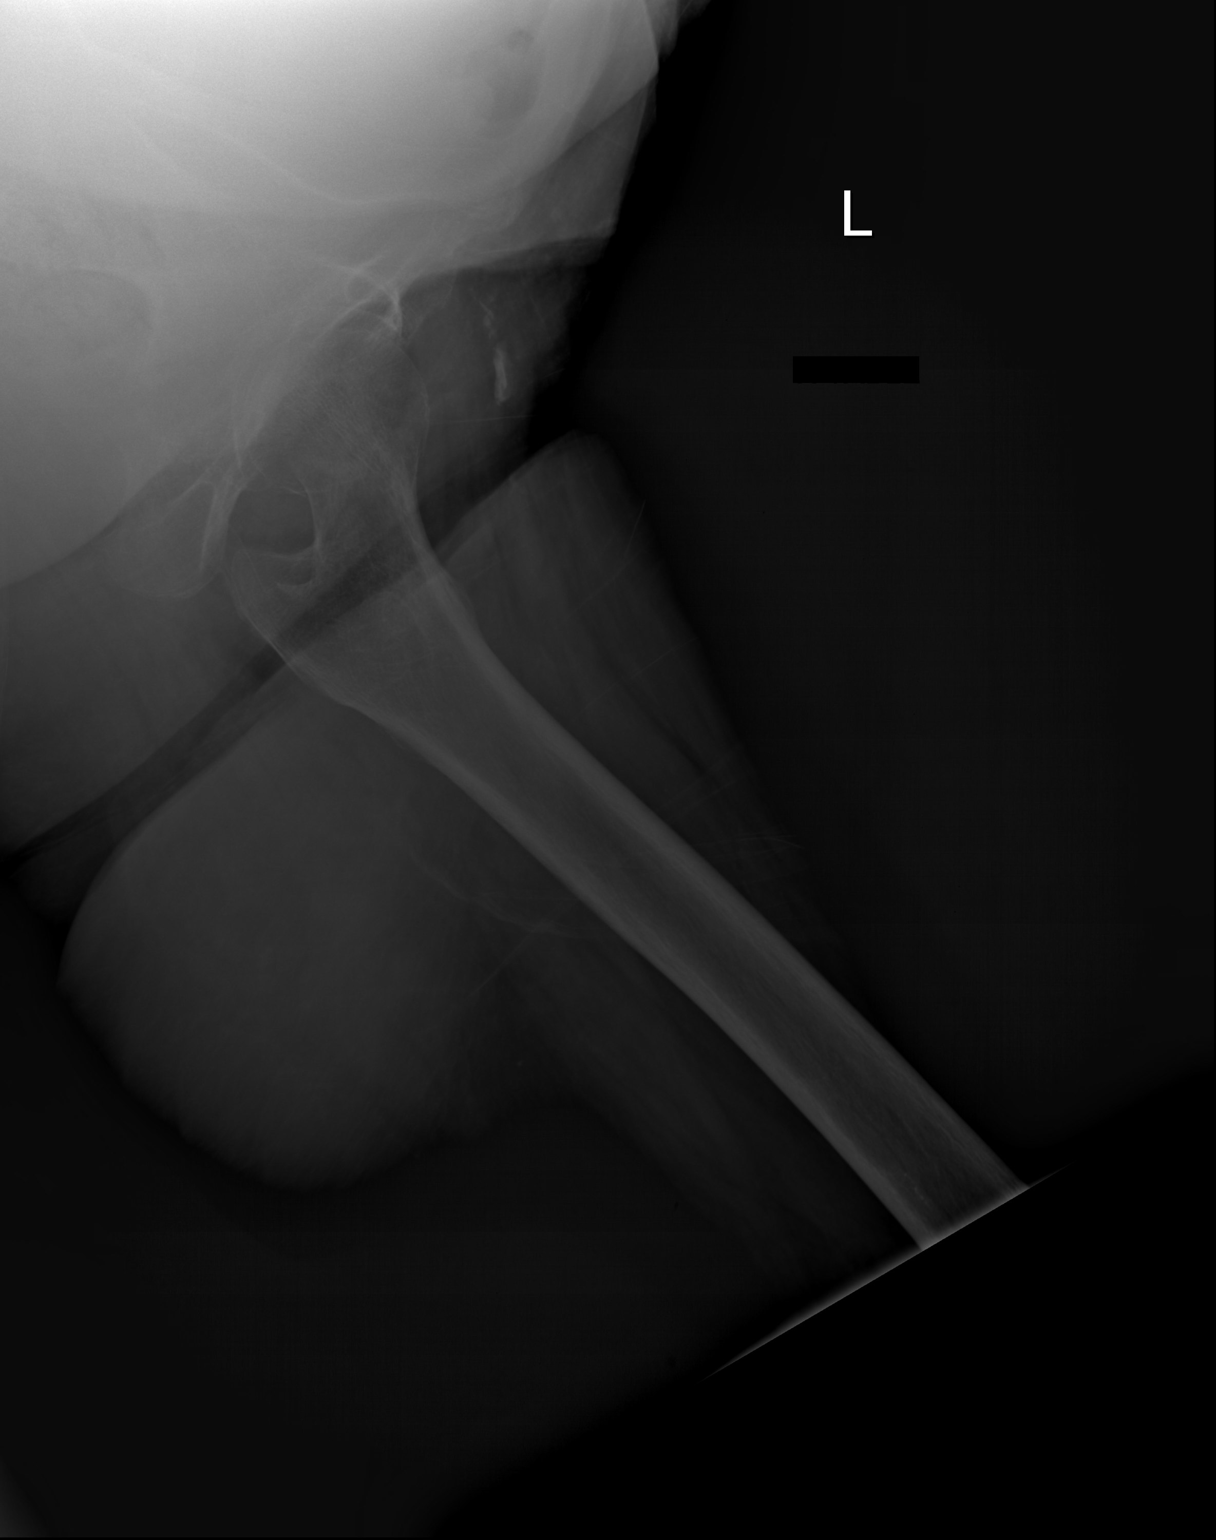

[3 of 3 positions shown; findings below may reference images not displayed]

FINDINGS: 3 views of the left hip submitted. There is mild displaced fracture
of left femoral neck. Diffuse osteopenia is noted.
IMPRESSION: Mild displaced fracture of left femoral neck. Diffuse osteopenia.

## 2015-06-17 ENCOUNTER — Encounter (HOSPITAL_COMMUNITY): Payer: Self-pay | Admitting: Emergency Medicine

## 2015-06-17 ENCOUNTER — Emergency Department (HOSPITAL_COMMUNITY): Payer: Medicare Other

## 2015-06-17 ENCOUNTER — Inpatient Hospital Stay (HOSPITAL_COMMUNITY)
Admission: EM | Admit: 2015-06-17 | Discharge: 2015-07-11 | DRG: 190 | Disposition: E | Payer: Medicare Other | Attending: Internal Medicine | Admitting: Internal Medicine

## 2015-06-17 DIAGNOSIS — N179 Acute kidney failure, unspecified: Secondary | ICD-10-CM | POA: Diagnosis present

## 2015-06-17 DIAGNOSIS — Z515 Encounter for palliative care: Secondary | ICD-10-CM | POA: Insufficient documentation

## 2015-06-17 DIAGNOSIS — M1990 Unspecified osteoarthritis, unspecified site: Secondary | ICD-10-CM | POA: Diagnosis present

## 2015-06-17 DIAGNOSIS — E7800 Pure hypercholesterolemia, unspecified: Secondary | ICD-10-CM | POA: Diagnosis present

## 2015-06-17 DIAGNOSIS — Z7982 Long term (current) use of aspirin: Secondary | ICD-10-CM

## 2015-06-17 DIAGNOSIS — I10 Essential (primary) hypertension: Secondary | ICD-10-CM | POA: Diagnosis not present

## 2015-06-17 DIAGNOSIS — F419 Anxiety disorder, unspecified: Secondary | ICD-10-CM | POA: Diagnosis not present

## 2015-06-17 DIAGNOSIS — J441 Chronic obstructive pulmonary disease with (acute) exacerbation: Principal | ICD-10-CM | POA: Diagnosis present

## 2015-06-17 DIAGNOSIS — Z880 Allergy status to penicillin: Secondary | ICD-10-CM

## 2015-06-17 DIAGNOSIS — F411 Generalized anxiety disorder: Secondary | ICD-10-CM | POA: Insufficient documentation

## 2015-06-17 DIAGNOSIS — D649 Anemia, unspecified: Secondary | ICD-10-CM | POA: Insufficient documentation

## 2015-06-17 DIAGNOSIS — Z9981 Dependence on supplemental oxygen: Secondary | ICD-10-CM | POA: Diagnosis not present

## 2015-06-17 DIAGNOSIS — R0600 Dyspnea, unspecified: Secondary | ICD-10-CM | POA: Insufficient documentation

## 2015-06-17 DIAGNOSIS — J9621 Acute and chronic respiratory failure with hypoxia: Secondary | ICD-10-CM | POA: Diagnosis present

## 2015-06-17 DIAGNOSIS — Z66 Do not resuscitate: Secondary | ICD-10-CM | POA: Diagnosis present

## 2015-06-17 DIAGNOSIS — Z720 Tobacco use: Secondary | ICD-10-CM | POA: Diagnosis present

## 2015-06-17 DIAGNOSIS — F17210 Nicotine dependence, cigarettes, uncomplicated: Secondary | ICD-10-CM | POA: Diagnosis present

## 2015-06-17 DIAGNOSIS — N289 Disorder of kidney and ureter, unspecified: Secondary | ICD-10-CM | POA: Insufficient documentation

## 2015-06-17 DIAGNOSIS — R0602 Shortness of breath: Secondary | ICD-10-CM | POA: Diagnosis present

## 2015-06-17 DIAGNOSIS — E872 Acidosis: Secondary | ICD-10-CM | POA: Diagnosis not present

## 2015-06-17 DIAGNOSIS — Z7902 Long term (current) use of antithrombotics/antiplatelets: Secondary | ICD-10-CM | POA: Diagnosis not present

## 2015-06-17 DIAGNOSIS — I69354 Hemiplegia and hemiparesis following cerebral infarction affecting left non-dominant side: Secondary | ICD-10-CM | POA: Diagnosis not present

## 2015-06-17 LAB — CREATININE, SERUM
Creatinine, Ser: 1.9 mg/dL — ABNORMAL HIGH (ref 0.44–1.00)
GFR calc Af Amer: 29 mL/min — ABNORMAL LOW (ref >60)
GFR calc non Af Amer: 25 mL/min — ABNORMAL LOW (ref >60)

## 2015-06-17 LAB — CBC
HEMATOCRIT: 36 % (ref 36.0–46.0)
HEMOGLOBIN: 10.8 g/dL — AB (ref 12.0–15.0)
MCH: 29.5 pg (ref 26.0–34.0)
MCHC: 30 g/dL (ref 30.0–36.0)
MCV: 98.4 fL (ref 78.0–100.0)
PLATELETS: 166 10*3/uL (ref 150–400)
RBC: 3.66 MIL/uL — AB (ref 3.87–5.11)
RDW: 13.2 % (ref 11.5–15.5)
WBC: 10.8 10*3/uL — ABNORMAL HIGH (ref 4.0–10.5)

## 2015-06-17 LAB — CBC WITH DIFFERENTIAL/PLATELET
BASOS ABS: 0 10*3/uL (ref 0.0–0.1)
BASOS PCT: 0 %
EOS PCT: 1 %
Eosinophils Absolute: 0.1 10*3/uL (ref 0.0–0.7)
HEMATOCRIT: 37.9 % (ref 36.0–46.0)
Hemoglobin: 11.6 g/dL — ABNORMAL LOW (ref 12.0–15.0)
Lymphocytes Relative: 15 %
Lymphs Abs: 2.1 10*3/uL (ref 0.7–4.0)
MCH: 29.7 pg (ref 26.0–34.0)
MCHC: 30.6 g/dL (ref 30.0–36.0)
MCV: 97.2 fL (ref 78.0–100.0)
MONO ABS: 0.5 10*3/uL (ref 0.1–1.0)
Monocytes Relative: 3 %
NEUTROS ABS: 11.2 10*3/uL — AB (ref 1.7–7.7)
Neutrophils Relative %: 81 %
PLATELETS: 173 10*3/uL (ref 150–400)
RBC: 3.9 MIL/uL (ref 3.87–5.11)
RDW: 13.1 % (ref 11.5–15.5)
WBC: 13.9 10*3/uL — ABNORMAL HIGH (ref 4.0–10.5)

## 2015-06-17 LAB — BASIC METABOLIC PANEL
ANION GAP: 8 (ref 5–15)
BUN: 12 mg/dL (ref 6–20)
CALCIUM: 9.8 mg/dL (ref 8.9–10.3)
CO2: 31 mmol/L (ref 22–32)
Chloride: 98 mmol/L — ABNORMAL LOW (ref 101–111)
Creatinine, Ser: 1.23 mg/dL — ABNORMAL HIGH (ref 0.44–1.00)
GFR calc Af Amer: 50 mL/min — ABNORMAL LOW (ref >60)
GFR, EST NON AFRICAN AMERICAN: 43 mL/min — AB (ref >60)
GLUCOSE: 238 mg/dL — AB (ref 65–99)
Potassium: 4.1 mmol/L (ref 3.5–5.1)
SODIUM: 137 mmol/L (ref 135–145)

## 2015-06-17 LAB — TROPONIN I: Troponin I: <0.03 ng/mL (ref <0.031)

## 2015-06-17 LAB — MAGNESIUM: MAGNESIUM: 3.7 mg/dL — AB (ref 1.7–2.4)

## 2015-06-17 LAB — PHOSPHORUS: PHOSPHORUS: 4.7 mg/dL — AB (ref 2.5–4.6)

## 2015-06-17 LAB — BRAIN NATRIURETIC PEPTIDE: B NATRIURETIC PEPTIDE 5: 36.9 pg/mL (ref 0.0–100.0)

## 2015-06-17 MED ORDER — MIRTAZAPINE 15 MG PO TABS
15.0000 mg | ORAL_TABLET | Freq: Every day | ORAL | Status: DC
  Administered 2015-06-17 – 2015-06-18 (×2): 15 mg via ORAL
  Filled 2015-06-17 (×2): qty 1

## 2015-06-17 MED ORDER — CLOPIDOGREL BISULFATE 75 MG PO TABS
75.0000 mg | ORAL_TABLET | Freq: Every day | ORAL | Status: DC
  Administered 2015-06-17 – 2015-06-18 (×2): 75 mg via ORAL
  Filled 2015-06-17 (×2): qty 1

## 2015-06-17 MED ORDER — ALBUTEROL SULFATE (2.5 MG/3ML) 0.083% IN NEBU: 2.5000 mg | INHALATION_SOLUTION | RESPIRATORY_TRACT | Status: DC | PRN

## 2015-06-17 MED ORDER — SODIUM CHLORIDE 0.9% FLUSH
3.0000 mL | Freq: Two times a day (BID) | INTRAVENOUS | Status: DC
  Administered 2015-06-17 – 2015-06-19 (×2): 3 mL via INTRAVENOUS

## 2015-06-17 MED ORDER — DILTIAZEM HCL ER COATED BEADS 120 MG PO CP24
120.0000 mg | ORAL_CAPSULE | Freq: Every day | ORAL | Status: DC
  Filled 2015-06-17: qty 1

## 2015-06-17 MED ORDER — MAGNESIUM OXIDE 400 MG PO TABS: 400.0000 mg | ORAL_TABLET | Freq: Two times a day (BID) | ORAL | Status: DC

## 2015-06-17 MED ORDER — IPRATROPIUM BROMIDE 0.02 % IN SOLN
0.5000 mg | Freq: Once | RESPIRATORY_TRACT | Status: AC
  Administered 2015-06-17: 0.5 mg via RESPIRATORY_TRACT

## 2015-06-17 MED ORDER — SODIUM CHLORIDE 0.9 % IV SOLN: 250.0000 mL | INTRAVENOUS | Status: DC | PRN

## 2015-06-17 MED ORDER — HEPARIN SODIUM (PORCINE) 5000 UNIT/ML IJ SOLN
5000.0000 [IU] | Freq: Three times a day (TID) | INTRAMUSCULAR | Status: DC
  Administered 2015-06-17 – 2015-06-19 (×6): 5000 [IU] via SUBCUTANEOUS
  Filled 2015-06-17 (×6): qty 1

## 2015-06-17 MED ORDER — PRAVASTATIN SODIUM 20 MG PO TABS
20.0000 mg | ORAL_TABLET | Freq: Every day | ORAL | Status: DC
  Administered 2015-06-18: 20 mg via ORAL
  Filled 2015-06-17: qty 1

## 2015-06-17 MED ORDER — LORAZEPAM 1 MG PO TABS
1.0000 mg | ORAL_TABLET | Freq: Three times a day (TID) | ORAL | Status: DC | PRN
  Administered 2015-06-19: 1 mg via ORAL
  Filled 2015-06-17: qty 1

## 2015-06-17 MED ORDER — ASPIRIN EC 81 MG PO TBEC
81.0000 mg | DELAYED_RELEASE_TABLET | Freq: Every day | ORAL | Status: DC
  Administered 2015-06-18: 81 mg via ORAL
  Filled 2015-06-17: qty 1

## 2015-06-17 MED ORDER — ACETAMINOPHEN 650 MG RE SUPP
650.0000 mg | Freq: Four times a day (QID) | RECTAL | Status: DC | PRN
  Filled 2015-06-17: qty 1

## 2015-06-17 MED ORDER — SODIUM CHLORIDE 0.9 % IV BOLUS (SEPSIS)
500.0000 mL | Freq: Once | INTRAVENOUS | Status: AC
Start: 2015-06-17 — End: 2015-06-17
  Administered 2015-06-17: 500 mL via INTRAVENOUS

## 2015-06-17 MED ORDER — KCL IN DEXTROSE-NACL 20-5-0.45 MEQ/L-%-% IV SOLN
INTRAVENOUS | Status: DC
  Administered 2015-06-17 – 2015-06-18 (×2): via INTRAVENOUS
  Administered 2015-06-19: 1000 mL via INTRAVENOUS
  Filled 2015-06-17 (×4): qty 1000

## 2015-06-17 MED ORDER — SODIUM CHLORIDE 0.9% FLUSH
3.0000 mL | INTRAVENOUS | Status: DC | PRN
  Administered 2015-06-18: 3 mL via INTRAVENOUS
  Filled 2015-06-17: qty 3

## 2015-06-17 MED ORDER — ALBUTEROL (5 MG/ML) CONTINUOUS INHALATION SOLN
10.0000 mg/h | INHALATION_SOLUTION | Freq: Once | RESPIRATORY_TRACT | Status: AC
  Administered 2015-06-17: 10 mg/h via RESPIRATORY_TRACT

## 2015-06-17 MED ORDER — IPRATROPIUM BROMIDE 0.02 % IN SOLN
0.5000 mg | Freq: Four times a day (QID) | RESPIRATORY_TRACT | Status: DC
  Administered 2015-06-17 – 2015-06-18 (×2): 0.5 mg via RESPIRATORY_TRACT
  Filled 2015-06-17 (×2): qty 2.5

## 2015-06-17 MED ORDER — ALBUTEROL (5 MG/ML) CONTINUOUS INHALATION SOLN
INHALATION_SOLUTION | RESPIRATORY_TRACT | Status: AC
  Administered 2015-06-17: 16:00:00
  Filled 2015-06-17: qty 20

## 2015-06-17 MED ORDER — METOPROLOL SUCCINATE ER 25 MG PO TB24
25.0000 mg | ORAL_TABLET | Freq: Every day | ORAL | Status: DC
  Filled 2015-06-17: qty 1

## 2015-06-17 MED ORDER — ACETAMINOPHEN 325 MG PO TABS
650.0000 mg | ORAL_TABLET | Freq: Four times a day (QID) | ORAL | Status: DC | PRN
  Administered 2015-06-18 (×2): 650 mg via ORAL
  Filled 2015-06-17 (×2): qty 2

## 2015-06-17 MED ORDER — IPRATROPIUM BROMIDE 0.02 % IN SOLN
RESPIRATORY_TRACT | Status: AC
  Administered 2015-06-17: 16:00:00
  Filled 2015-06-17: qty 2.5

## 2015-06-17 MED ORDER — SODIUM CHLORIDE 0.9% FLUSH
3.0000 mL | Freq: Two times a day (BID) | INTRAVENOUS | Status: DC
  Administered 2015-06-18 – 2015-06-19 (×2): 3 mL via INTRAVENOUS

## 2015-06-17 MED ORDER — ALBUTEROL (5 MG/ML) CONTINUOUS INHALATION SOLN
10.0000 mg | INHALATION_SOLUTION | Freq: Once | RESPIRATORY_TRACT | Status: AC
  Administered 2015-06-17: 10 mg via RESPIRATORY_TRACT

## 2015-06-17 MED ORDER — MAGNESIUM SULFATE 2 GM/50ML IV SOLN
2.0000 g | Freq: Once | INTRAVENOUS | Status: AC
  Administered 2015-06-17: 2 g via INTRAVENOUS
  Filled 2015-06-17: qty 50

## 2015-06-17 MED ORDER — BUDESONIDE 0.25 MG/2ML IN SUSP
0.2500 mg | Freq: Two times a day (BID) | RESPIRATORY_TRACT | Status: DC
  Administered 2015-06-17 – 2015-06-19 (×4): 0.25 mg via RESPIRATORY_TRACT
  Filled 2015-06-17 (×4): qty 2

## 2015-06-17 MED ORDER — NICOTINE 21 MG/24HR TD PT24
21.0000 mg | MEDICATED_PATCH | Freq: Every day | TRANSDERMAL | Status: DC
  Administered 2015-06-18 – 2015-06-19 (×2): 21 mg via TRANSDERMAL
  Filled 2015-06-17 (×2): qty 1

## 2015-06-17 MED ORDER — ALBUTEROL SULFATE (2.5 MG/3ML) 0.083% IN NEBU
2.5000 mg | INHALATION_SOLUTION | RESPIRATORY_TRACT | Status: DC
  Administered 2015-06-17 – 2015-06-18 (×2): 2.5 mg via RESPIRATORY_TRACT
  Filled 2015-06-17 (×2): qty 3

## 2015-06-17 MED ORDER — MAGNESIUM OXIDE 400 (241.3 MG) MG PO TABS
400.0000 mg | ORAL_TABLET | Freq: Two times a day (BID) | ORAL | Status: DC
  Administered 2015-06-17 – 2015-06-18 (×3): 400 mg via ORAL
  Filled 2015-06-17 (×3): qty 1

## 2015-06-17 NOTE — ED Notes (Signed)
Attempted to call report

## 2015-06-17 NOTE — Progress Notes (Signed)
Patient has stated she is not wearing the Bipap. Patient is refusing. Patient is in no distress at this time. RT will continue to monitor.

## 2015-06-17 NOTE — ED Notes (Signed)
Pt has COPD and began to experience severe SOB after she smoked a cigarrett.  Pt has wheezing in all lobes, 84% on RA, 94% on 8L O2, total of 15 albuteral, 125 sol.  20 R AC.  Upon arrival she began "gurgling"

## 2015-06-17 NOTE — ED Notes (Signed)
RN paged MD to notify of BP, Provider called back and ordered bolus normal saline.

## 2015-06-17 NOTE — H&P (Signed)
History and Physical    Carolyn [name]:[id number] DOB: August 02, 1940 DOA: 07-06-2015  PCP: Eartha Inch, MD   Chief Complaint: Shortness of breath dyspnea on exertion  HPI: Carolyn [name] is a 75 y.o. female with medical history significant of advanced COPD on 4 L of supplemental oxygen at home. Presents with 2 days complaint of worsening shortness of breath the problem has been persistent and gradually getting worse. Nothing she is aware of makes it better. Activity makes it worse. Given worsening condition patient presented to the ED for further evaluation recommendations. History is limited as patient is significantly short of breath. Patient does not want to be intubated.   ED Course: Patient was given Solu-Medrol and continuous albuterol. Given DO NOT RESUSCITATE status plans are to place on BiPAP  Review of Systems: As per HPI otherwise 10 point review of systems negative.    Past Medical History  Diagnosis Date  . Hypertension   . COPD (chronic obstructive pulmonary disease) (HCC)   . Anemia   . Headaches, cluster   . Wears glasses   . CVA (cerebral vascular accident) (HCC) 01/2010  . Complication of anesthesia     " trouble waking up "  . PONV (postoperative nausea and vomiting)   . Shortness of breath   . Arthritis   . Hypercholesteremia     Past Surgical History  Procedure Laterality Date  . Abdominal hysterectomy    . Varicose vein surgery    . Left hand    . Cholecystectomy  07/20/10    Dr Andrey Campanile; Lap chole with ioc  . Hip pinning,cannulated Left 11/2012    Dr Rennis Chris  . Hand surgery Left     BALL JOINT  . Hip pinning,cannulated Left 11/18/2012    Procedure: CANNULATED HIP PINNING;  Surgeon: Senaida Lange, MD;  Location: [facility] OR;  Service: Orthopedics;  Laterality: Left;     reports that she has been smoking Cigarettes.  She has a 3 pack-year smoking history. She has never used smokeless tobacco. She reports that she does not drink alcohol or use illicit  drugs.  Allergies  Allergen Reactions  . Penicillins Anaphylaxis    Anaphylaxis  . Codeine Rash    Reaction unknown    Family History  Problem Relation Age of Onset  . Heart disease Father   . Heart disease Mother   . Diabetes Father   . Hypertension Mother   . Cancer Maternal Aunt     ovarian, breast  . Lung disease Father     Prior to Admission medications   Medication Sig Start Date End Date Taking? Authorizing Provider  acetaminophen (TYLENOL) 325 MG tablet Take 650 mg by mouth 3 (three) times daily as needed. For pain    Historical Provider, MD  albuterol (PROVENTIL HFA;VENTOLIN HFA) 108 (90 BASE) MCG/ACT inhaler Inhale 2 puffs into the lungs every 4 (four) hours as needed. For shortness of breath    Historical Provider, MD  aspirin EC 81 MG tablet Take 81 mg by mouth daily.    Historical Provider, MD  budesonide-formoterol (SYMBICORT) 160-4.5 MCG/ACT inhaler Inhale 2 puffs into the lungs 2 (two) times daily.    Historical Provider, MD  ciprofloxacin (CIPRO) 500 MG tablet Take 1 tablet (500 mg total) by mouth 2 (two) times daily. 12/30/13   Joseph Art, DO  clopidogrel (PLAVIX) 75 MG tablet Take 75 mg by mouth at bedtime.    Historical Provider, MD  diltiazem (CARDIZEM CD) 120 MG 24  hr capsule Take 1 capsule (120 mg total) by mouth daily. 12/28/12   Vanetta MuldersScott Zackowski, MD  docusate sodium 100 MG CAPS Take 100 mg by mouth 2 (two) times daily. 12/29/13   Joseph ArtJessica U Vann, DO  gabapentin (NEURONTIN) 300 MG capsule Take 600 mg by mouth 3 (three) times daily.    Historical Provider, MD  guaiFENesin (MUCINEX) 600 MG 12 hr tablet Take 1 tablet (600 mg total) by mouth 2 (two) times daily. 12/29/13   Joseph ArtJessica U Vann, DO  HYDROcodone-acetaminophen (NORCO) 5-325 MG per tablet Take 1 tablet by mouth every 4 (four) hours as needed. 12/30/13   Joseph ArtJessica U Vann, DO  Lactobacillus (ACIDOPHILUS EXTRA STRENGTH PO) Take 1 tablet by mouth daily.    Historical Provider, MD  LORazepam (ATIVAN) 1 MG  tablet Take 1 mg by mouth every 8 (eight) hours as needed for anxiety.    Historical Provider, MD  magnesium oxide (MAG-OX) 400 MG tablet Take 400 mg by mouth 2 (two) times daily.    Historical Provider, MD  metoprolol succinate (TOPROL-XL) 25 MG 24 hr tablet Take 25 mg by mouth daily.    Historical Provider, MD  mirtazapine (REMERON) 15 MG tablet Take 15 mg by mouth at bedtime.    Historical Provider, MD  nicotine (NICODERM CQ - DOSED IN MG/24 HOURS) 21 mg/24hr patch Place 1 patch (21 mg total) onto the skin daily. 12/29/13   Joseph ArtJessica U Vann, DO  ondansetron (ZOFRAN) 4 MG tablet Take 4 mg by mouth every 6 (six) hours as needed. For nausea    Historical Provider, MD  polyethylene glycol (MIRALAX / GLYCOLAX) packet Take 17 g by mouth daily. 11/20/12   Belkys A Regalado, MD  pravastatin (PRAVACHOL) 20 MG tablet Take 20 mg by mouth daily.    Historical Provider, MD  predniSONE (DELTASONE) 10 MG tablet 50 mg x 2 days, 40 mg x 2 days, 30 mg x 2 days, 20 mg x 2 days, 10 mg x 2 days then d/c 12/29/13   Joseph ArtJessica U Vann, DO  solifenacin (VESICARE) 5 MG tablet Take 5 mg by mouth daily.    Historical Provider, MD    Physical Exam: Filed Vitals:   06/29/2015 1544 06/11/2015 1545 06/16/2015 1552 06/16/2015 1826  BP: 137/76     Pulse: 117     Resp: 36     SpO2: 99% 99% 99% 98%    Constitutional: somnolent but responds to questions, looks uncomfortable Filed Vitals:   06/13/2015 1544 06/30/2015 1545 06/16/2015 1552 07/02/2015 1826  BP: 137/76     Pulse: 117     Resp: 36     SpO2: 99% 99% 99% 98%   Eyes: PERRL, lids and conjunctivae normal ENMT: Mucous membranes are moist. No exudate noted on limited exam. Neck: normal, supple, no masses, no thyromegaly Respiratory: Increased work of breathing, equal chest rise, poor inspiratory effort, no wheezes, no rhales Cardiovascular: s1 and s2 present, no murmurs / rubs / gallops. Mild lower extremity edema Abdomen: no tenderness, no masses palpated. No hepatosplenomegaly.  Bowel sounds positive.  Musculoskeletal: No joint deformity upper and lower extremities. Good ROM, no contractures. Normal muscle tone.  Skin: no rashes, lesions, ulcers. No indurationOn limited exam Neurologic: Difficult exam secondary to shortness of breath limited cooperation given increased work of breathing Psychiatric: Difficult exam given limited cooperation given shortness of breath   Labs on Admission: I have personally reviewed following labs and imaging studies  CBC:  Recent Labs Lab 06/18/2015 1652  WBC 13.9*  NEUTROABS 11.2*  HGB 11.6*  HCT 37.9  MCV 97.2  PLT 173   Basic Metabolic Panel:  Recent Labs Lab 06/13/2015 1652  NA 137  K 4.1  CL 98*  CO2 31  GLUCOSE 238*  BUN 12  CREATININE 1.23*  CALCIUM 9.8   GFR: CrCl cannot be calculated (Unknown ideal weight.). Liver Function Tests: No results for input(s): AST, ALT, ALKPHOS, BILITOT, PROT, ALBUMIN in the last 168 hours. No results for input(s): LIPASE, AMYLASE in the last 168 hours. No results for input(s): AMMONIA in the last 168 hours. Coagulation Profile: No results for input(s): INR, PROTIME in the last 168 hours. Cardiac Enzymes:  Recent Labs Lab 06/16/2015 1652  TROPONINI <0.03   BNP (last 3 results) No results for input(s): PROBNP in the last 8760 hours. HbA1C: No results for input(s): HGBA1C in the last 72 hours. CBG: No results for input(s): GLUCAP in the last 168 hours. Lipid Profile: No results for input(s): CHOL, HDL, LDLCALC, TRIG, CHOLHDL, LDLDIRECT in the last 72 hours. Thyroid Function Tests: No results for input(s): TSH, T4TOTAL, FREET4, T3FREE, THYROIDAB in the last 72 hours. Anemia Panel: No results for input(s): VITAMINB12, FOLATE, FERRITIN, TIBC, IRON, RETICCTPCT in the last 72 hours. Urine analysis:    Component Value Date/Time   COLORURINE YELLOW 12/28/2013 0923   APPEARANCEUR TURBID* 12/28/2013 0923   LABSPEC 1.015 12/28/2013 0923   PHURINE 7.0 12/28/2013 0923    GLUCOSEU NEGATIVE 12/28/2013 0923   HGBUR NEGATIVE 12/28/2013 0923   BILIRUBINUR NEGATIVE 12/28/2013 0923   KETONESUR 15* 12/28/2013 0923   PROTEINUR NEGATIVE 12/28/2013 0923   UROBILINOGEN 0.2 12/28/2013 0923   NITRITE NEGATIVE 12/28/2013 0923   LEUKOCYTESUR SMALL* 12/28/2013 0923   Sepsis Labs: !!!!!!!!!!!!!!!!!!!!!!!!!!!!!!!!!!!!!!!!!!!! (procalcitonin:4,lacticidven:4) )No results found for this or any previous visit (from the past 240 hour(s)).   Radiological Exams on Admission: Dg Chest Port 1 View  06/16/2015  CLINICAL DATA:  Respiratory distress EXAM: PORTABLE CHEST 1 VIEW COMPARISON:  12/27/2013, 01/13/2012, CT of the chest FINDINGS: Cardiac shadow is stable. The lungs are well aerated bilaterally. No focal infiltrate or sizable effusion is seen. Small pericardial fat pad is noted on the left adjacent to the spine. This is confirmed on previous CT examination. No bony abnormality is noted. IMPRESSION: No acute abnormality seen. Electronically Signed   By: Alcide Clever M.D.   On: 06/29/2015 16:15    Assessment/Plan   COPD exacerbation (HCC) - We'll place on Solu-Medrol, Pulmicort, DuoNeb  - Agree with BiPAP as tolerated - Patient is DO NOT INTUBATE  Active Problems:   Tobacco abuse - Continue nicotine patch    Hypertension -Hold blood pressure medications if patient becomes hypotensive.   DVT prophylaxis: Heparin Code Status: DNI Family Communication: spoke with patient and family at bedside Disposition Plan: to be determined Consults called: None Admission status: inpatient   Penny Pia MD [facility] Pager [phone]  If 7PM-7AM, please contact night-coverage [email or web address] Password [facility]  06/24/2015, 6:27 PM

## 2015-06-17 NOTE — ED Notes (Signed)
While conducting rounds, found pt's O2 at 4L, RN left it at 3L.  Family stated that MD changed to 4L.  Pillow was found and given.

## 2015-06-17 NOTE — ED Notes (Signed)
Darlene from [facility] called to find out status of pt.  With Pt's permission undated current status.

## 2015-06-17 NOTE — ED Notes (Signed)
Removed pt from breathing treatment, placed on 3L [place] per respiratory.

## 2015-06-17 NOTE — ED Notes (Signed)
Family made aware of pts bed assignment 

## 2015-06-17 NOTE — ED Provider Notes (Signed)
CSN: [id number]     Arrival date & time 06/25/2015  1543 History   First MD Initiated Contact with Patient 06/29/2015 1542     Chief Complaint  Patient presents with  . Shortness of Breath     (Consider location/radiation/quality/duration/timing/severity/associated sxs/prior Treatment) Patient is a 75 y.o. female presenting with shortness of breath. The history is provided by the patient and the EMS personnel. The history is limited by the condition of the patient (Respiratory distress).  Shortness of Breath She has a history of COPD, hypertension, diastolic heart failure, stroke. She states that she has had a dry cough and difficulty breathing over the last 2 days. Symptoms of been generally responding to albuterol via nebulizer, but got worse today. She denies fever or chills. She denies chest pain. Fire rescue gave her a 5 mg albuterol nebulizer treatment and the EMS repeated a nebulizer. She was generally doing well but got much worse on arrival to the ED. Oxygen saturation was 84% which went up to 94% when oxygen was increased. She is DO NOT RESUSCITATE and confirms that she does not wish to be intubated.  Past Medical History  Diagnosis Date  . Hypertension   . COPD (chronic obstructive pulmonary disease)   . Anemia   . Headaches, cluster   . Wears glasses   . CVA (cerebral vascular accident) 01/2010  . Complication of anesthesia     " trouble waking up "  . PONV (postoperative nausea and vomiting)   . Shortness of breath   . Arthritis   . Hypercholesteremia    Past Surgical History  Procedure Laterality Date  . Abdominal hysterectomy    . Varicose vein surgery    . Left hand    . Cholecystectomy  07/20/10    Dr Andrey Campanile; Lap chole with ioc  . Hip pinning,cannulated Left 11/2012    Dr Rennis Chris  . Hand surgery Left     BALL JOINT  . Hip pinning,cannulated Left 11/18/2012    Procedure: CANNULATED HIP PINNING;  Surgeon: Senaida Lange, MD;  Location: [facility] OR;  Service: Orthopedics;   Laterality: Left;   Family History  Problem Relation Age of Onset  . Heart disease Father   . Heart disease Mother   . Diabetes Father   . Hypertension Mother   . Cancer Maternal Aunt     ovarian, breast  . Lung disease Father    Social History  Substance Use Topics  . Smoking status: Current Some Day Smoker -- 0.10 packs/day for 30 years    Types: Cigarettes  . Smokeless tobacco: Never Used  . Alcohol Use: No   OB History    No data available     Review of Systems  Unable to perform ROS: Severe respiratory distress  Respiratory: Positive for shortness of breath.       Allergies  Penicillins and Codeine  Home Medications   Prior to Admission medications   Medication Sig Start Date End Date Taking? Authorizing Provider  acetaminophen (TYLENOL) 325 MG tablet Take 650 mg by mouth 3 (three) times daily as needed. For pain    Historical Provider, MD  albuterol (PROVENTIL HFA;VENTOLIN HFA) 108 (90 BASE) MCG/ACT inhaler Inhale 2 puffs into the lungs every 4 (four) hours as needed. For shortness of breath    Historical Provider, MD  aspirin EC 81 MG tablet Take 81 mg by mouth daily.    Historical Provider, MD  budesonide-formoterol (SYMBICORT) 160-4.5 MCG/ACT inhaler Inhale 2 puffs into the lungs  2 (two) times daily.    Historical Provider, MD  ciprofloxacin (CIPRO) 500 MG tablet Take 1 tablet (500 mg total) by mouth 2 (two) times daily. 12/30/13   Joseph ArtJessica U Vann, DO  clopidogrel (PLAVIX) 75 MG tablet Take 75 mg by mouth at bedtime.    Historical Provider, MD  diltiazem (CARDIZEM CD) 120 MG 24 hr capsule Take 1 capsule (120 mg total) by mouth daily. 12/28/12   Vanetta MuldersScott Zackowski, MD  docusate sodium 100 MG CAPS Take 100 mg by mouth 2 (two) times daily. 12/29/13   Joseph ArtJessica U Vann, DO  gabapentin (NEURONTIN) 300 MG capsule Take 600 mg by mouth 3 (three) times daily.    Historical Provider, MD  guaiFENesin (MUCINEX) 600 MG 12 hr tablet Take 1 tablet (600 mg total) by mouth 2 (two)  times daily. 12/29/13   Joseph ArtJessica U Vann, DO  HYDROcodone-acetaminophen (NORCO) 5-325 MG per tablet Take 1 tablet by mouth every 4 (four) hours as needed. 12/30/13   Joseph ArtJessica U Vann, DO  Lactobacillus (ACIDOPHILUS EXTRA STRENGTH PO) Take 1 tablet by mouth daily.    Historical Provider, MD  LORazepam (ATIVAN) 1 MG tablet Take 1 mg by mouth every 8 (eight) hours as needed for anxiety.    Historical Provider, MD  magnesium oxide (MAG-OX) 400 MG tablet Take 400 mg by mouth 2 (two) times daily.    Historical Provider, MD  metoprolol succinate (TOPROL-XL) 25 MG 24 hr tablet Take 25 mg by mouth daily.    Historical Provider, MD  mirtazapine (REMERON) 15 MG tablet Take 15 mg by mouth at bedtime.    Historical Provider, MD  nicotine (NICODERM CQ - DOSED IN MG/24 HOURS) 21 mg/24hr patch Place 1 patch (21 mg total) onto the skin daily. 12/29/13   Joseph ArtJessica U Vann, DO  ondansetron (ZOFRAN) 4 MG tablet Take 4 mg by mouth every 6 (six) hours as needed. For nausea    Historical Provider, MD  polyethylene glycol (MIRALAX / GLYCOLAX) packet Take 17 g by mouth daily. 11/20/12   Belkys A Regalado, MD  pravastatin (PRAVACHOL) 20 MG tablet Take 20 mg by mouth daily.    Historical Provider, MD  predniSONE (DELTASONE) 10 MG tablet 50 mg x 2 days, 40 mg x 2 days, 30 mg x 2 days, 20 mg x 2 days, 10 mg x 2 days then d/c 12/29/13   Joseph ArtJessica U Vann, DO  solifenacin (VESICARE) 5 MG tablet Take 5 mg by mouth daily.    Historical Provider, MD   BP 137/76 mmHg  Pulse 117  Resp 36  SpO2 99% Physical Exam  Nursing note and vitals reviewed.  75 year old female, in moderate respiratory distress. She is using accessory muscles of respiration in does have difficulty completing a sentence on one breath. Vital signs are significant for tachycardia and tachypnea. Oxygen saturation is 99%, which is normal, but is obtained with high flow oxygen while receiving a nebulizer treatment. Head is normocephalic and atraumatic. PERRLA, EOMI.  Oropharynx is clear. Neck is nontender and supple without adenopathy or JVD. Back is nontender and there is no CVA tenderness. Lungs have diffuse inspiratory and Torre wheezes without rales or rhonchi. Air flow is moderately diminished. Chest is nontender. Heart has regular rate and rhythm without murmur. Abdomen is soft, flat, nontender without masses or hepatosplenomegaly and peristalsis is normoactive. Extremities have no cyanosis or edema, full range of motion is present. Skin is warm and dry without rash. Neurologic: Mental status is normal, cranial nerves are  intact, there are no sensory deficits. Left hemiparesis present.  ED Course  Procedures (including critical care time) Labs Review Results for orders placed or performed during the hospital encounter of 06/18/15  Basic metabolic panel  Result Value Ref Range   Sodium 137 135 - 145 mmol/L   Potassium 4.1 3.5 - 5.1 mmol/L   Chloride 98 (L) 101 - 111 mmol/L   CO2 31 22 - 32 mmol/L   Glucose, Bld 238 (H) 65 - 99 mg/dL   BUN 12 6 - 20 mg/dL   Creatinine, Ser 1.61 (H) 0.44 - 1.00 mg/dL   Calcium 9.8 8.9 - 09.6 mg/dL   GFR calc non Af Amer 43 (L) >60 mL/min   GFR calc Af Amer 50 (L) >60 mL/min   Anion gap 8 5 - 15  CBC with Differential  Result Value Ref Range   WBC 13.9 (H) 4.0 - 10.5 K/uL   RBC 3.90 3.87 - 5.11 MIL/uL   Hemoglobin 11.6 (L) 12.0 - 15.0 g/dL   HCT 04.5 40.9 - 81.1 %   MCV 97.2 78.0 - 100.0 fL   MCH 29.7 26.0 - 34.0 pg   MCHC 30.6 30.0 - 36.0 g/dL   RDW 91.4 78.2 - 95.6 %   Platelets 173 150 - 400 K/uL   Neutrophils Relative % 81 %   Neutro Abs 11.2 (H) 1.7 - 7.7 K/uL   Lymphocytes Relative 15 %   Lymphs Abs 2.1 0.7 - 4.0 K/uL   Monocytes Relative 3 %   Monocytes Absolute 0.5 0.1 - 1.0 K/uL   Eosinophils Relative 1 %   Eosinophils Absolute 0.1 0.0 - 0.7 K/uL   Basophils Relative 0 %   Basophils Absolute 0.0 0.0 - 0.1 K/uL  Troponin I  Result Value Ref Range   Troponin I <0.03 <0.031 ng/mL  Brain  natriuretic peptide  Result Value Ref Range   B Natriuretic Peptide 36.9 0.0 - 100.0 pg/mL    Imaging Review Dg Chest Port 1 View  2015/06/18  CLINICAL DATA:  Respiratory distress EXAM: PORTABLE CHEST 1 VIEW COMPARISON:  12/27/2013, 01/13/2012, CT of the chest FINDINGS: Cardiac shadow is stable. The lungs are well aerated bilaterally. No focal infiltrate or sizable effusion is seen. Small pericardial fat pad is noted on the left adjacent to the spine. This is confirmed on previous CT examination. No bony abnormality is noted. IMPRESSION: No acute abnormality seen. Electronically Signed   By: Alcide Clever M.D.   On: 2015-06-18 16:15   I have personally reviewed and evaluated these images and lab results as part of my medical decision-making.   EKG Interpretation   Date/Time:  Wednesday 06-18-2015 15:52:31 EDT Ventricular Rate:  119 PR Interval:    QRS Duration: 135 QT Interval:  358 QTC Calculation: 504 R Axis:   -6 Text Interpretation:  Multifocal atrial tachycardia Baseline wander  Artifact When compared with ECG of 12/28/2013, Multifocal atrial  tachycardia has replaced Sinus rhythm Confirmed by Preston Fleeting  MD, Aurther Harlin  ([id number]) on Jun 18, 2015 3:58:26 PM      CRITICAL CARE Performed by: MVHQI,ONGEX Total critical care time: 120 minutes Critical care time was exclusive of separately billable procedures and treating other patients. Critical care was necessary to treat or prevent imminent or life-threatening deterioration. Critical care was time spent personally by me on the following activities: development of treatment plan with patient and/or surrogate as well as nursing, discussions with consultants, evaluation of patient's response to treatment, examination of patient, obtaining history  from patient or surrogate, ordering and performing treatments and interventions, ordering and review of laboratory studies, ordering and review of radiographic studies, pulse oximetry and re-evaluation of  patient's condition.  MDM   Final diagnoses:  COPD exacerbation (HCC)  Normochromic normocytic anemia  Renal insufficiency    COPD exacerbation. Chest x-ray will be obtained to rule out pneumonia. Old records reviewed confirming prior hospitalizations for COPD exacerbation. She'll be given additional albuterol with ipratropium. EMS had given albuterol with ipratropium as well as methylprednisolone. She'll be given magnesium and additional albuterol with ipratropium. Anticipate she will need to be admitted.  Following above-noted treatment, she seems somewhat more lethargic but is moving air somewhat better. Diffuse wheezes persist. She is continuing to use accessory muscles of respiration and appears to be tiring. She will be placed on BiPAP and given additional albuterol with ipratropium. Family is present and I have explained the seriousness of her condition and confirmed, once again, patient's desire not to be intubated. Laboratory evaluation shows stable anemia. Renal insufficiency is mild and slightly worse than baseline although last value was from 1.5 years ago. Troponin and BNP are normal. Case is discussed with Dr. Cena Benton of [facility] who agrees to admit the patient.  Dione Booze, MD 07/13/2015 1754

## 2015-06-18 DIAGNOSIS — J9621 Acute and chronic respiratory failure with hypoxia: Secondary | ICD-10-CM

## 2015-06-18 DIAGNOSIS — Z720 Tobacco use: Secondary | ICD-10-CM

## 2015-06-18 DIAGNOSIS — N289 Disorder of kidney and ureter, unspecified: Secondary | ICD-10-CM

## 2015-06-18 DIAGNOSIS — F411 Generalized anxiety disorder: Secondary | ICD-10-CM

## 2015-06-18 DIAGNOSIS — I10 Essential (primary) hypertension: Secondary | ICD-10-CM

## 2015-06-18 DIAGNOSIS — Z515 Encounter for palliative care: Secondary | ICD-10-CM

## 2015-06-18 DIAGNOSIS — D649 Anemia, unspecified: Secondary | ICD-10-CM

## 2015-06-18 DIAGNOSIS — J9622 Acute and chronic respiratory failure with hypercapnia: Secondary | ICD-10-CM

## 2015-06-18 DIAGNOSIS — J441 Chronic obstructive pulmonary disease with (acute) exacerbation: Principal | ICD-10-CM

## 2015-06-18 DIAGNOSIS — R0600 Dyspnea, unspecified: Secondary | ICD-10-CM

## 2015-06-18 LAB — BASIC METABOLIC PANEL
Anion gap: 7 (ref 5–15)
BUN: 21 mg/dL — AB (ref 6–20)
CALCIUM: 9.6 mg/dL (ref 8.9–10.3)
CHLORIDE: 98 mmol/L — AB (ref 101–111)
CO2: 30 mmol/L (ref 22–32)
CREATININE: 1.97 mg/dL — AB (ref 0.44–1.00)
GFR calc Af Amer: 28 mL/min — ABNORMAL LOW (ref >60)
GFR calc non Af Amer: 24 mL/min — ABNORMAL LOW (ref >60)
Glucose, Bld: 215 mg/dL — ABNORMAL HIGH (ref 65–99)
Potassium: 4 mmol/L (ref 3.5–5.1)
SODIUM: 135 mmol/L (ref 135–145)

## 2015-06-18 LAB — CBC
HCT: 31.8 % — ABNORMAL LOW (ref 36.0–46.0)
Hemoglobin: 10.1 g/dL — ABNORMAL LOW (ref 12.0–15.0)
MCH: 30.5 pg (ref 26.0–34.0)
MCHC: 31.8 g/dL (ref 30.0–36.0)
MCV: 96.1 fL (ref 78.0–100.0)
PLATELETS: 164 10*3/uL (ref 150–400)
RBC: 3.31 MIL/uL — ABNORMAL LOW (ref 3.87–5.11)
RDW: 13.2 % (ref 11.5–15.5)
WBC: 11.9 10*3/uL — AB (ref 4.0–10.5)

## 2015-06-18 LAB — GLUCOSE, CAPILLARY
GLUCOSE-CAPILLARY: 150 mg/dL — AB (ref 65–99)
Glucose-Capillary: 142 mg/dL — ABNORMAL HIGH (ref 65–99)

## 2015-06-18 LAB — MRSA PCR SCREENING: MRSA by PCR: NEGATIVE

## 2015-06-18 MED ORDER — INSULIN ASPART 100 UNIT/ML ~~LOC~~ SOLN: 0.0000 [IU] | Freq: Every day | SUBCUTANEOUS | Status: DC

## 2015-06-18 MED ORDER — METHYLPREDNISOLONE SODIUM SUCC 125 MG IJ SOLR
60.0000 mg | Freq: Four times a day (QID) | INTRAMUSCULAR | Status: DC
  Administered 2015-06-18: 60 mg via INTRAVENOUS
  Filled 2015-06-18: qty 2

## 2015-06-18 MED ORDER — METHYLPREDNISOLONE SODIUM SUCC 125 MG IJ SOLR
60.0000 mg | Freq: Three times a day (TID) | INTRAMUSCULAR | Status: DC
Start: 2015-06-18 — End: 2015-06-19
  Administered 2015-06-18 – 2015-06-19 (×2): 60 mg via INTRAVENOUS
  Filled 2015-06-18 (×2): qty 2

## 2015-06-18 MED ORDER — ALUM & MAG HYDROXIDE-SIMETH 200-200-20 MG/5ML PO SUSP
30.0000 mL | Freq: Once | ORAL | Status: AC
  Administered 2015-06-18: 30 mL via ORAL
  Filled 2015-06-18: qty 30

## 2015-06-18 MED ORDER — DOXYCYCLINE HYCLATE 100 MG PO TABS
100.0000 mg | ORAL_TABLET | Freq: Two times a day (BID) | ORAL | Status: DC
  Administered 2015-06-18 (×2): 100 mg via ORAL
  Filled 2015-06-18 (×2): qty 1

## 2015-06-18 MED ORDER — ARFORMOTEROL TARTRATE 15 MCG/2ML IN NEBU
15.0000 ug | INHALATION_SOLUTION | Freq: Two times a day (BID) | RESPIRATORY_TRACT | Status: DC
  Administered 2015-06-18 – 2015-06-19 (×3): 15 ug via RESPIRATORY_TRACT
  Filled 2015-06-18 (×3): qty 2

## 2015-06-18 MED ORDER — INSULIN ASPART 100 UNIT/ML ~~LOC~~ SOLN
0.0000 [IU] | Freq: Three times a day (TID) | SUBCUTANEOUS | Status: DC
  Administered 2015-06-18: 2 [IU] via SUBCUTANEOUS

## 2015-06-18 MED ORDER — IPRATROPIUM-ALBUTEROL 0.5-2.5 (3) MG/3ML IN SOLN
3.0000 mL | RESPIRATORY_TRACT | Status: DC
  Administered 2015-06-18 – 2015-06-19 (×7): 3 mL via RESPIRATORY_TRACT
  Filled 2015-06-18 (×7): qty 3

## 2015-06-18 NOTE — Progress Notes (Signed)
[facility]                                                                              Patient Demographics  Carolyn [name], is a 75 y.o. female, DOB - 09/15/1940, QMV:[id number]  Admit date - 06/29/2015   Admitting Physician Penny Piarlando Vega, MD  Outpatient Primary MD for the patient is Carolyn [name],MICHAEL C, MD  Outpatient specialists:   LOS - 1  days    Chief Complaint  Patient presents with  . Shortness of Breath       Brief summary   Carolyn ShellMaria L [name] is a 75 y.o. female with medical history significant of advanced COPD on 4 L of supplemental oxygen at homePresented with 2 days of worsening shortness of breath, wheezing. Patient was admitted for COPD exacerbation   Assessment & Plan    Acute on chronic hypoxic respiratory failure with  COPD exacerbation (HCC) - Continue Pulmicort, brovana scheduled nebs, IV Solu-Medrol - Continue flutter valve, O2 - Low-grade fevers, added doxycycline  Tobacco abuse - Nicotine patch, patient counseled on nicotine cessation  Essential hypertension - BP currently soft  Acute kidney injury - Patient received IV fluid hydration, recheck BMET  Code Status: DO NOT RESUSCITATE  DVT Prophylaxis:   SCD's   Family Communication: Discussed in detail with the patient, all imaging results, lab results explained to the patient    Disposition Plan: Hopefully DC next 24-48 hours  Time Spent in minutes   25 minutes  Procedures:  None   Consultants:   None   Antimicrobials:   Doxycycline 6/8   Medications  Scheduled Meds: . arformoterol  15 mcg Nebulization BID  . aspirin EC  81 mg Oral Daily  . budesonide (PULMICORT) nebulizer solution  0.25 mg Nebulization BID  . clopidogrel  75 mg Oral QHS  . diltiazem  120 mg Oral Daily  . doxycycline  100 mg Oral Q12H  . heparin  5,000 Units Subcutaneous Q8H  . ipratropium-albuterol  3 mL Nebulization Q4H  . magnesium oxide  400 mg Oral BID  . methylPREDNISolone (SOLU-MEDROL)  injection  60 mg Intravenous Q6H  . metoprolol succinate  25 mg Oral Daily  . mirtazapine  15 mg Oral QHS  . nicotine  21 mg Transdermal Daily  . pravastatin  20 mg Oral q1800  . sodium chloride flush  3 mL Intravenous Q12H  . sodium chloride flush  3 mL Intravenous Q12H   Continuous Infusions: . dextrose 5 % and 0.45 % NaCl with KCl 20 mEq/L 75 mL/hr at 06/25/2015 2239   PRN Meds:.sodium chloride, acetaminophen **OR** acetaminophen, albuterol, LORazepam, sodium chloride flush   Antibiotics   Anti-infectives    Start     Dose/Rate Route Frequency Ordered Stop   06/18/15 1000  doxycycline (VIBRA-TABS) tablet 100 mg     100 mg Oral Every 12 hours 06/18/15 [id number]          Subjective:   Carolyn AcresMaria Martine was seen and examined today. Mild wheezing otherwise improving. Patient denies dizziness, chest pain, abdominal pain, N/V/D/C, new weakness, numbess, tingling. Low-grade fevers. No acute events overnight.    Objective:   Filed  Vitals:   06/14/2015 2155 06/18/15 0125 06/18/15 0549 06/18/15 0837  BP:   109/35   Pulse:   106 111  Temp:   100.9 F (38.3 C)   TempSrc:   Oral   Resp:   20 22  Height:      Weight:      SpO2: 95% 95% 99% 96%   No intake or output data in the 24 hours ending 06/18/15 1059   Wt Readings from Last 3 Encounters:  07/05/2015 69.174 kg (152 lb 8 oz)  12/28/13 76.8 kg (169 lb 5 oz)  03/25/13 65.318 kg (144 lb)     Exam  General: Alert and oriented x 3, NAD  HEENT:  PERRLA, EOMI, Anicteric Sclera, mucous membranes moist.   Neck: Supple, no JVD, no masses  Cardiovascular: S1 S2 auscultated, no rubs, murmurs or gallops. Regular rate and rhythm.  Respiratory: Diffuse wheezing expiratory bilaterally, improving   Gastrointestinal: Soft, nontender, nondistended, + bowel sounds  Ext: no cyanosis clubbing or edema  Neuro: AAOx3, Cr N's II- XII. Strength 5/5 upper and lower extremities bilaterally  Skin: No rashes  Psych: Normal affect and demeanor,  alert and oriented x3    Data Reviewed:  I have personally reviewed following labs and imaging studies  Micro Results Recent Results (from the past 240 hour(s))  MRSA PCR Screening     Status: None   Collection Time: 06/18/15  4:41 AM  Result Value Ref Range Status   MRSA by PCR NEGATIVE NEGATIVE Final    Comment:        The GeneXpert MRSA Assay (FDA approved for NASAL specimens only), is one component of a comprehensive MRSA colonization surveillance program. It is not intended to diagnose MRSA infection nor to guide or monitor treatment for MRSA infections.     Radiology Reports Dg Chest Port 1 View  07/09/2015  CLINICAL DATA:  Respiratory distress EXAM: PORTABLE CHEST 1 VIEW COMPARISON:  12/27/2013, 01/13/2012, CT of the chest FINDINGS: Cardiac shadow is stable. The lungs are well aerated bilaterally. No focal infiltrate or sizable effusion is seen. Small pericardial fat pad is noted on the left adjacent to the spine. This is confirmed on previous CT examination. No bony abnormality is noted. IMPRESSION: No acute abnormality seen. Electronically Signed   By: Alcide Clever M.D.   On: 07/08/2015 16:15    Lab Data:  CBC:  Recent Labs Lab 07/09/2015 1652 06/16/2015 2200 06/18/15 0504  WBC 13.9* 10.8* 11.9*  NEUTROABS 11.2*  --   --   HGB 11.6* 10.8* 10.1*  HCT 37.9 36.0 31.8*  MCV 97.2 98.4 96.1  PLT 173 166 164   Basic Metabolic Panel:  Recent Labs Lab 06/16/2015 1652 06/11/2015 2200 06/18/15 0504  NA 137  --  135  K 4.1  --  4.0  CL 98*  --  98*  CO2 31  --  30  GLUCOSE 238*  --  215*  BUN 12  --  21*  CREATININE 1.23* 1.90* 1.97*  CALCIUM 9.8  --  9.6  MG  --  3.7*  --   PHOS  --  4.7*  --    GFR: Estimated Creatinine Clearance: 24.7 mL/min (by C-G formula based on Cr of 1.97). Liver Function Tests: No results for input(s): AST, ALT, ALKPHOS, BILITOT, PROT, ALBUMIN in the last 168 hours. No results for input(s): LIPASE, AMYLASE in the last 168 hours. No  results for input(s): AMMONIA in the last 168 hours. Coagulation Profile: No  results for input(s): INR, PROTIME in the last 168 hours. Cardiac Enzymes:  Recent Labs Lab Jul 09, 2015 1652  TROPONINI <0.03   BNP (last 3 results) No results for input(s): PROBNP in the last 8760 hours. HbA1C: No results for input(s): HGBA1C in the last 72 hours. CBG: No results for input(s): GLUCAP in the last 168 hours. Lipid Profile: No results for input(s): CHOL, HDL, LDLCALC, TRIG, CHOLHDL, LDLDIRECT in the last 72 hours. Thyroid Function Tests: No results for input(s): TSH, T4TOTAL, FREET4, T3FREE, THYROIDAB in the last 72 hours. Anemia Panel: No results for input(s): VITAMINB12, FOLATE, FERRITIN, TIBC, IRON, RETICCTPCT in the last 72 hours. Urine analysis:    Component Value Date/Time   COLORURINE YELLOW 12/28/2013 0923   APPEARANCEUR TURBID* 12/28/2013 0923   LABSPEC 1.015 12/28/2013 0923   PHURINE 7.0 12/28/2013 0923   GLUCOSEU NEGATIVE 12/28/2013 0923   HGBUR NEGATIVE 12/28/2013 0923   BILIRUBINUR NEGATIVE 12/28/2013 0923   KETONESUR 15* 12/28/2013 0923   PROTEINUR NEGATIVE 12/28/2013 0923   UROBILINOGEN 0.2 12/28/2013 0923   NITRITE NEGATIVE 12/28/2013 0923   LEUKOCYTESUR SMALL* 12/28/2013 1610     RAI,RIPUDEEP M.D. [facility] 06/18/2015, 10:59 AM  Pager: [phone] Between 7am to 7pm - call Pager - [phone]  After 7pm go to [email or web address] - password [email or web address]  Call night coverage person covering after 7pm

## 2015-06-18 NOTE — Care Management Note (Signed)
Case Management Note  Patient Details  Name: Jacklyn ShellMaria L Hartshorn MRN: [id number] Date of Birth: 05/17/1940  Subjective/Objective:                 Spoke with patient at the bedside. She states that she lives at [facility]. [facility]bul[facility]. They provide her transport for appointments. Requested long line for O2 to reach bathroom. [facility]    Action/Plan:  No CM needs at this time, will continue to follow for DC planning.  Expected Discharge Date:                  Expected Discharge Plan:  Assisted Living / Rest Home ([facility])  In-House Referral:  Clinical Social Work  Discharge planning Services  CM Consult  Post Acute Care Choice:    Choice offered to:     DME Arranged:    DME Agency:     HH Arranged:    HH Agency:     Status of Service:  In process, will continue to follow  Medicare Important Message Given:    Date Medicare IM Given:    Medicare IM give by:    Date Additional Medicare IM Given:    Additional Medicare Important Message give by:     If discussed at Long Length of Stay Meetings, dates discussed:    Additional Comments:  Lawerance SabalDebbie Bethanne Mule, RN 06/18/2015, 1:41 PM

## 2015-06-18 NOTE — Clinical Social Work Note (Signed)
Clinical Social Work Assessment  Patient Details  Name: Carolyn [name] MRN: [id number] Date of Birth: 03/25/1940  Date of referral:  06/18/15               Reason for consult:  Facility Placement                Permission sought to share information with:  Family Supports Permission granted to share information::  Yes, Verbal Permission Granted  Name::        Agency::  [facility]   Relationship::  sister  Contact Information:     Housing/Transportation Living arrangements for the past 2 months:  [facility] Source of Information:  Patient Patient Interpreter Needed:  None Criminal Activity/Legal Involvement Pertinent to Current Situation/Hospitalization:  No - Comment as needed Significant Relationships:  Siblings (sister at bedside) Lives with:  Facility Resident Do you feel safe going back to the place where you live?  Yes Need for family participation in patient care:  No (Coment)  Care giving concerns: pt lives in [facility]- no caregiving concerns with facility at this time.   Social Worker assessment / plan: CSW spoke with pt concerning plan for time of DC.   Employment status:  Retired Health and safety inspectornsurance information:  Medicare PT Recommendations:  Skilled Nursing Facility Information / Referral to community resources:     Patient/Family's Response to care:  Pt agreeable to return to ALF at time of DC.  Patient/Family's Understanding of and Emotional Response to Diagnosis, Current Treatment, and Prognosis: No questions or concerns- just anxious that she gets to return to ALF soon.  Emotional Assessment Appearance:  Appears stated age Attitude/Demeanor/Rapport:    Affect (typically observed):  Appropriate, Pleasant Orientation:  Oriented to Situation, Oriented to  Time, Oriented to Place, Oriented to Self Alcohol / Substance use:  Not Applicable Psych involvement (Current and /or in the community):  No (Comment)  Discharge Needs  Concerns to be  addressed:  Care Coordination Readmission within the last 30 days:  No Current discharge risk:  Physical Impairment Barriers to Discharge:  Continued Medical Work up   Peabody EnergyHoloman, Kharter Sestak M, LCSW 06/18/2015, 2:51 PM

## 2015-06-18 NOTE — Progress Notes (Signed)
CSW following for pt return to ALF- per MD ready in next 24-48 hours  CSW spoke with [facility] who states if pt will come back over the weekend they will need DC summary Friday for review  CSW will continue to follow  Merlyn LotJenna Holoman, [facility] CenterCSWA Clinical Social Worker [phone]

## 2015-06-18 NOTE — Progress Notes (Signed)
Inpatient Diabetes Program Recommendations  AACE/ADA: New Consensus Statement on Inpatient Glycemic Control (2015)  Target Ranges:  Prepandial:   less than 140 mg/dL      Peak postprandial:   less than 180 mg/dL (1-2 hours)      Critically ill patients:  140 - 180 mg/dL   Lab Results  Component Value Date   HGBA1C 5.3 12/28/2013   Results for Carolyn [name], Carolyn [name] (MRN [id number]) as of 06/18/2015 11:08  Ref. Range 06/18/2015 05:04  Glucose Latest Ref Range: 65-99 mg/dL 409215 (H)   Review of Glycemic Control  Diabetes history: None  Inpatient Diabetes Program Recommendations:  Correction (SSI): Patient on IV Solumedrol 60 mg Q6hrs. Lab glucose this am was elevated >200 mg/dl. Please consider checking CBGs and possibly ordering Novolog Sensitive Correction TID while on steroids.  Thanks,  Christena DeemShannon Sharalee Witman RN, MSN, [facility] Inpatient Diabetes Coordinator Team Pager [phone] (8a-5p)

## 2015-06-19 ENCOUNTER — Inpatient Hospital Stay (HOSPITAL_COMMUNITY): Payer: Medicare Other

## 2015-06-19 DIAGNOSIS — F411 Generalized anxiety disorder: Secondary | ICD-10-CM | POA: Insufficient documentation

## 2015-06-19 DIAGNOSIS — N289 Disorder of kidney and ureter, unspecified: Secondary | ICD-10-CM | POA: Insufficient documentation

## 2015-06-19 DIAGNOSIS — D649 Anemia, unspecified: Secondary | ICD-10-CM | POA: Insufficient documentation

## 2015-06-19 DIAGNOSIS — Z515 Encounter for palliative care: Secondary | ICD-10-CM | POA: Insufficient documentation

## 2015-06-19 DIAGNOSIS — R0600 Dyspnea, unspecified: Secondary | ICD-10-CM | POA: Insufficient documentation

## 2015-06-19 LAB — BLOOD GAS, ARTERIAL
Acid-Base Excess: 4.9 mmol/L — ABNORMAL HIGH (ref 0.0–2.0)
Bicarbonate: 32 mEq/L — ABNORMAL HIGH (ref 20.0–24.0)
DRAWN BY: 27605
O2 CONTENT: 4 L/min
O2 SAT: 93.6 %
PH ART: 7.233 — AB (ref 7.350–7.450)
Patient temperature: 98.6
TCO2: 34.4 mmol/L (ref 0–100)
pCO2 arterial: 78.6 mmHg (ref 35.0–45.0)
pO2, Arterial: 73.6 mmHg — ABNORMAL LOW (ref 80.0–100.0)

## 2015-06-19 LAB — BASIC METABOLIC PANEL
Anion gap: 4 — ABNORMAL LOW (ref 5–15)
BUN: 24 mg/dL — AB (ref 6–20)
CHLORIDE: 98 mmol/L — AB (ref 101–111)
CO2: 34 mmol/L — AB (ref 22–32)
Calcium: 10 mg/dL (ref 8.9–10.3)
Creatinine, Ser: 1.34 mg/dL — ABNORMAL HIGH (ref 0.44–1.00)
GFR calc Af Amer: 45 mL/min — ABNORMAL LOW (ref >60)
GFR calc non Af Amer: 39 mL/min — ABNORMAL LOW (ref >60)
Glucose, Bld: 141 mg/dL — ABNORMAL HIGH (ref 65–99)
POTASSIUM: 5.4 mmol/L — AB (ref 3.5–5.1)
SODIUM: 136 mmol/L (ref 135–145)

## 2015-06-19 LAB — CBC
HEMATOCRIT: 31.9 % — AB (ref 36.0–46.0)
HEMOGLOBIN: 10 g/dL — AB (ref 12.0–15.0)
MCH: 30.1 pg (ref 26.0–34.0)
MCHC: 31.3 g/dL (ref 30.0–36.0)
MCV: 96.1 fL (ref 78.0–100.0)
Platelets: 187 10*3/uL (ref 150–400)
RBC: 3.32 MIL/uL — AB (ref 3.87–5.11)
RDW: 13 % (ref 11.5–15.5)
WBC: 19.7 10*3/uL — ABNORMAL HIGH (ref 4.0–10.5)

## 2015-06-19 LAB — LACTIC ACID, PLASMA: Lactic Acid, Venous: 0.6 mmol/L (ref 0.5–2.0)

## 2015-06-19 LAB — PROCALCITONIN: PROCALCITONIN: 2.04 ng/mL

## 2015-06-19 LAB — BRAIN NATRIURETIC PEPTIDE: B NATRIURETIC PEPTIDE 5: 295 pg/mL — AB (ref 0.0–100.0)

## 2015-06-19 LAB — GLUCOSE, CAPILLARY: Glucose-Capillary: 220 mg/dL — ABNORMAL HIGH (ref 65–99)

## 2015-06-19 LAB — HEMOGLOBIN A1C
HEMOGLOBIN A1C: 5.1 % (ref 4.8–5.6)
Mean Plasma Glucose: 100 mg/dL

## 2015-06-19 MED ORDER — MORPHINE BOLUS VIA INFUSION
2.0000 mg | INTRAVENOUS | Status: DC | PRN
  Administered 2015-06-19 (×5): 2 mg via INTRAVENOUS
  Filled 2015-06-19 (×6): qty 2

## 2015-06-19 MED ORDER — GLYCOPYRROLATE 0.2 MG/ML IJ SOLN
0.2000 mg | INTRAMUSCULAR | Status: DC | PRN
  Administered 2015-06-19: 0.2 mg via INTRAVENOUS
  Filled 2015-06-19 (×3): qty 1

## 2015-06-19 MED ORDER — LORAZEPAM 2 MG/ML IJ SOLN
1.0000 mg | INTRAMUSCULAR | Status: AC
  Administered 2015-06-19 (×2): 1 mg via INTRAVENOUS
  Filled 2015-06-19 (×2): qty 1

## 2015-06-19 MED ORDER — METOPROLOL TARTRATE 5 MG/5ML IV SOLN: 2.5000 mg | Freq: Three times a day (TID) | INTRAVENOUS | Status: DC

## 2015-06-19 MED ORDER — FUROSEMIDE 10 MG/ML IJ SOLN
40.0000 mg | Freq: Once | INTRAMUSCULAR | Status: AC
  Administered 2015-06-19: 40 mg via INTRAVENOUS

## 2015-06-19 MED ORDER — FUROSEMIDE 10 MG/ML IJ SOLN
40.0000 mg | Freq: Every day | INTRAMUSCULAR | Status: DC
  Administered 2015-06-19: 40 mg via INTRAVENOUS
  Filled 2015-06-19: qty 4

## 2015-06-19 MED ORDER — ONDANSETRON 4 MG PO TBDP
4.0000 mg | ORAL_TABLET | Freq: Four times a day (QID) | ORAL | Status: DC | PRN
  Filled 2015-06-19: qty 1

## 2015-06-19 MED ORDER — ACETAMINOPHEN 650 MG RE SUPP: 650.0000 mg | Freq: Four times a day (QID) | RECTAL | Status: DC | PRN

## 2015-06-19 MED ORDER — POLYVINYL ALCOHOL 1.4 % OP SOLN
1.0000 [drp] | Freq: Four times a day (QID) | OPHTHALMIC | Status: DC | PRN
  Filled 2015-06-19: qty 15

## 2015-06-19 MED ORDER — DEXTROSE 5 % IV SOLN
1.0000 g | Freq: Three times a day (TID) | INTRAVENOUS | Status: DC
  Administered 2015-06-19: 1 g via INTRAVENOUS
  Filled 2015-06-19 (×3): qty 1

## 2015-06-19 MED ORDER — SODIUM CHLORIDE 0.9 % IV SOLN: 250.0000 mL | INTRAVENOUS | Status: DC | PRN

## 2015-06-19 MED ORDER — CALCIUM GLUCONATE 10 % IV SOLN
1.0000 g | Freq: Once | INTRAVENOUS | Status: AC
  Administered 2015-06-19: 1 g via INTRAVENOUS
  Filled 2015-06-19 (×2): qty 10

## 2015-06-19 MED ORDER — MORPHINE SULFATE (PF) 2 MG/ML IV SOLN
1.0000 mg | INTRAVENOUS | Status: DC | PRN
  Administered 2015-06-19: 2 mg via INTRAVENOUS
  Filled 2015-06-19: qty 1

## 2015-06-19 MED ORDER — VANCOMYCIN HCL IN DEXTROSE 1-5 GM/200ML-% IV SOLN: 1000.0000 mg | INTRAVENOUS | Status: DC

## 2015-06-19 MED ORDER — ACETAMINOPHEN 325 MG PO TABS: 650.0000 mg | ORAL_TABLET | Freq: Four times a day (QID) | ORAL | Status: DC | PRN

## 2015-06-19 MED ORDER — DILTIAZEM HCL 100 MG IV SOLR
5.0000 mg/h | INTRAVENOUS | Status: AC
  Administered 2015-06-19: 5 mg/h via INTRAVENOUS
  Filled 2015-06-19: qty 100

## 2015-06-19 MED ORDER — ONDANSETRON HCL 4 MG/2ML IJ SOLN: 4.0000 mg | Freq: Four times a day (QID) | INTRAMUSCULAR | Status: DC | PRN

## 2015-06-19 MED ORDER — GLYCOPYRROLATE 1 MG PO TABS
1.0000 mg | ORAL_TABLET | ORAL | Status: DC | PRN
  Filled 2015-06-19: qty 1

## 2015-06-19 MED ORDER — GLYCOPYRROLATE 0.2 MG/ML IJ SOLN
0.2000 mg | INTRAMUSCULAR | Status: DC | PRN
  Filled 2015-06-19: qty 1

## 2015-06-19 MED ORDER — HALOPERIDOL 0.5 MG PO TABS
0.5000 mg | ORAL_TABLET | ORAL | Status: DC | PRN
  Filled 2015-06-19: qty 1

## 2015-06-19 MED ORDER — METHYLPREDNISOLONE SODIUM SUCC 125 MG IJ SOLR
60.0000 mg | Freq: Four times a day (QID) | INTRAMUSCULAR | Status: DC
  Administered 2015-06-19: 60 mg via INTRAVENOUS
  Filled 2015-06-19: qty 2

## 2015-06-19 MED ORDER — VANCOMYCIN HCL 10 G IV SOLR
1500.0000 mg | Freq: Once | INTRAVENOUS | Status: AC
  Administered 2015-06-19: 1500 mg via INTRAVENOUS
  Filled 2015-06-19: qty 1500

## 2015-06-19 MED ORDER — FAMOTIDINE IN NACL 20-0.9 MG/50ML-% IV SOLN
20.0000 mg | Freq: Two times a day (BID) | INTRAVENOUS | Status: DC
  Administered 2015-06-19: 20 mg via INTRAVENOUS
  Filled 2015-06-19: qty 50

## 2015-06-19 MED ORDER — MORPHINE SULFATE 25 MG/ML IV SOLN
1.0000 mg/h | INTRAVENOUS | Status: DC
  Administered 2015-06-19: 1 mg/h via INTRAVENOUS
  Administered 2015-06-19: 2 mg/h via INTRAVENOUS
  Filled 2015-06-19: qty 10

## 2015-06-19 MED ORDER — IPRATROPIUM BROMIDE 0.02 % IN SOLN
0.5000 mg | RESPIRATORY_TRACT | Status: DC
  Administered 2015-06-19 (×2): 0.5 mg via RESPIRATORY_TRACT
  Filled 2015-06-19 (×3): qty 2.5

## 2015-06-19 MED ORDER — SODIUM CHLORIDE 0.9% FLUSH: 3.0000 mL | INTRAVENOUS | Status: DC | PRN

## 2015-06-19 MED ORDER — BIOTENE DRY MOUTH MT LIQD: 15.0000 mL | OROMUCOSAL | Status: DC | PRN

## 2015-06-19 MED ORDER — DILTIAZEM LOAD VIA INFUSION
10.0000 mg | Freq: Once | INTRAVENOUS | Status: AC
  Administered 2015-06-19: 10 mg via INTRAVENOUS
  Filled 2015-06-19: qty 10

## 2015-06-19 MED ORDER — METOPROLOL TARTRATE 5 MG/5ML IV SOLN
5.0000 mg | Freq: Three times a day (TID) | INTRAVENOUS | Status: DC
  Administered 2015-06-19: 5 mg via INTRAVENOUS
  Filled 2015-06-19: qty 5

## 2015-06-19 MED ORDER — INSULIN ASPART 100 UNIT/ML ~~LOC~~ SOLN
0.0000 [IU] | SUBCUTANEOUS | Status: DC
  Administered 2015-06-19: 3 [IU] via SUBCUTANEOUS

## 2015-06-19 MED ORDER — GLYCOPYRROLATE 0.2 MG/ML IJ SOLN
0.4000 mg | Freq: Four times a day (QID) | INTRAMUSCULAR | Status: DC
  Filled 2015-06-19: qty 2

## 2015-06-19 MED ORDER — FUROSEMIDE 10 MG/ML IJ SOLN
INTRAMUSCULAR | Status: AC
  Administered 2015-06-19: 10 mg
  Filled 2015-06-19: qty 4

## 2015-06-19 MED ORDER — LORAZEPAM 2 MG/ML IJ SOLN
1.0000 mg | INTRAMUSCULAR | Status: DC | PRN
  Administered 2015-06-19: 1 mg via INTRAVENOUS
  Filled 2015-06-19 (×2): qty 1

## 2015-06-19 MED ORDER — CETYLPYRIDINIUM CHLORIDE 0.05 % MT LIQD: 7.0000 mL | Freq: Two times a day (BID) | OROMUCOSAL | Status: DC

## 2015-06-19 MED ORDER — LORAZEPAM 2 MG/ML IJ SOLN
1.0000 mg/h | INTRAVENOUS | Status: DC
  Filled 2015-06-19: qty 25

## 2015-06-19 MED ORDER — DIPHENHYDRAMINE HCL 50 MG/ML IJ SOLN
12.5000 mg | INTRAMUSCULAR | Status: DC | PRN
  Administered 2015-06-19: 12.5 mg via INTRAVENOUS
  Filled 2015-06-19: qty 1

## 2015-06-19 MED ORDER — LEVALBUTEROL HCL 0.63 MG/3ML IN NEBU
0.6300 mg | INHALATION_SOLUTION | RESPIRATORY_TRACT | Status: DC
  Administered 2015-06-19 (×2): 0.63 mg via RESPIRATORY_TRACT
  Filled 2015-06-19 (×3): qty 3

## 2015-06-19 MED ORDER — HALOPERIDOL LACTATE 5 MG/ML IJ SOLN: 0.5000 mg | INTRAMUSCULAR | Status: DC | PRN

## 2015-06-19 MED ORDER — SODIUM CHLORIDE 0.9% FLUSH
3.0000 mL | Freq: Two times a day (BID) | INTRAVENOUS | Status: DC
  Administered 2015-06-19: 3 mL via INTRAVENOUS

## 2015-06-19 MED ORDER — MORPHINE SULFATE (PF) 2 MG/ML IV SOLN
2.0000 mg | Freq: Once | INTRAVENOUS | Status: AC
  Administered 2015-06-19: 2 mg via INTRAVENOUS

## 2015-06-19 MED ORDER — MORPHINE SULFATE (PF) 2 MG/ML IV SOLN
INTRAVENOUS | Status: AC
  Administered 2015-06-19: 2 mg via INTRAVENOUS
  Filled 2015-06-19: qty 1

## 2015-06-19 MED ORDER — HALOPERIDOL LACTATE 2 MG/ML PO CONC
0.5000 mg | ORAL | Status: DC | PRN
Start: 2015-06-19 — End: 2015-06-20
  Filled 2015-06-19: qty 0.3

## 2015-06-19 MED ORDER — LORAZEPAM 2 MG/ML IJ SOLN
1.0000 mg | INTRAMUSCULAR | Status: DC | PRN
Start: 2015-06-19 — End: 2015-06-20

## 2015-06-19 MED ORDER — LORAZEPAM 2 MG/ML IJ SOLN
2.0000 mg | INTRAMUSCULAR | Status: AC
  Administered 2015-06-19: 2 mg via INTRAVENOUS

## 2015-06-20 NOTE — Progress Notes (Signed)
Patient on comfort care measures. Patient expired at 2209. Family present and supportive. [name] (daughter) present. [name] second witness for time of death. Elray McgregorMary Lynch NP notified and signed death certificate.

## 2015-06-20 NOTE — Discharge Summary (Addendum)
Expiration Note/ Death Summary  Carolyn ShellMaria L Revoir  MR#: [id number]  DOB:01/04/1941  Date of Admission: 07/09/2015 Date of Death: 06/11/2015 at 2209  Attending Physician:RAI,RIPUDEEP  Patient's PCP: Eartha InchBADGER,MICHAEL C, MD  Consults:  Palliative medicine  Cause of Death: Acute on chronic respiratory failure secondary to advanced/severe COPD  Secondary Diagnoses  . COPD exacerbation (HCC) with history of advanced COPD, oxygen dependent 4 L at home continuous  . Hypertension . Tobacco abuse   Chronic respiratory failure   Essential hypertension   History of CVA  Brief H and P: For complete details please refer to admission H and P, but in brief patient was a 75 year old female with medical history significant for advanced COPD on 4 L of supplemental O2 continuous at home, active tobacco abuse, hypertension had presented to ED with worsening shortness of breath and wheezing. History was limited as patient was significant short of breath in ED. She did not want to be intubated.  Hospital Course:  Patient was a 75 year old female with advanced COPD, oxygen dependent on 4 L continuous habit was admitted with COPD exacerbation with wheezing and difficulty breathing. She had made it clear in ED that she did not want to be intubated. She was admitted and placed on scheduled bronchodilators, IV Solu-Medrol, brovana, pulmicort and antibiotics. In the Initial period of hospitalization, patient seemed to be improving. However on the morning of 6/9, patient was found to be lethargic, wheezing with pursed lip breathing. Rapid response was called and patient was given 1 dose of Lasix, chest x-ray did not show any acute infiltrates. Patient was spiking low-grade fevers, may have aspirated, hence antibiotics were broadened to vancomycin and aztreonam. Stat ABG showed pH of 7.23, PCO2 78.6, PO2 73.6. She was placed on BiPAP. She was transferred to stepdown unit. Palliative medicine consult was also placed for goals  of care. Palliative medicine and myself discussed in detail with patient's daughter at the bedside and updated about overall poor prognosis given her advanced COPD. Patient's daughter requested comfort care status as her mother would not have wished for any aggressive care at this point.   Patient was placed on comfort care and BiPAP was discontinued per her family's wishes. Patient expired at 2209 on 07/06/2015 with daughter at the bedside.   SignedThad Ranger:  RAI,RIPUDEEP M.D. [facility] 06/20/2015, 6:29 AM Pager: [phone]   Coding query: Patient had advanced/severe end-stage COPD with acute on chronic respiratory failure, possible mild pulmonary edema on exam, chest x-ray however did not show any pleural effusions. Patient had no acute CHF exacerbation.    RAI,RIPUDEEP M.D. [facility] 07/16/2015, 12:46 PM  Pager: [phone]

## 2015-06-20 NOTE — Progress Notes (Signed)
Spoke to pharmacy and recommended to waste morphine drip in pyxis. Patient unavailable in pyxis due to expiration. Morphine drip wasted in sink. Witnessed by Lorrin Jacksonracie Nelsein, RN.

## 2015-06-24 LAB — CULTURE, BLOOD (ROUTINE X 2)
CULTURE: NO GROWTH
Culture: NO GROWTH

## 2015-07-11 NOTE — Progress Notes (Signed)
SLP Cancellation Note  Patient Details Name: Carolyn [name] MRN: [id number] DOB: 08/01/1940   Cancelled treatment:       Reason Eval/Treat Not Completed: Medical issues which prohibited therapy. Pt is currently requiring BiPAP, and is inappropriate for evaluation at this time. Will continue efforts. RN aware.  Carolyn [name], Carolyn [name] 07/10/2015, 9:44 AM  Carolyn [name], [facility] Medical Services DistrictMSP, CCC-SLP [phone]

## 2015-07-11 NOTE — Care Management Important Message (Signed)
Important Message  Patient Details  Name: Jacklyn ShellMaria L Catalfamo MRN: [id number] Date of Birth: 07/20/1940   Medicare Important Message Given:  Yes    Kyla BalzarineShealy, Lakima Dona Abena 07/07/2015, 10:49 AM

## 2015-07-11 NOTE — Consult Note (Signed)
Consultation Note Date: 07-12-2015   Patient Name: Carolyn [name]  DOB: 02/11/1940  MRN: [id number]  Age / Sex: 75 y.o., female  PCP: Chesley Noon, MD Referring Physician: Mendel Corning, MD  Reason for Consultation: Establishing goals of care, Non pain symptom management, Terminal Care and Withdrawal of life-sustaining treatment  HPI/Patient Profile: 75 y.o. female  with past medical history of advanced COPD, CVA, and anemia admitted on 06/28/2015 with COPD exacerbation.  On 6/8 the patient appeared much improved and had a good day, but on the morning of 6/9 she took a dramatic turn for the worse.  She had developed pursed lipped breathing and respiratory acidosis with a CO2 of 78.6 on ABG.     Clinical Assessment and Goals of Care: I met her daughter Arrie Aran), Sister and brother Liliane Channel) at bedside.  Dawn tearfully told me that her mother insisted on being a DNR and previously had never wanted bipap either.  Today however, her mother relinquished and accepted bipap.   Dawn's biggest fear is that her mother will feel she is suffocating.  I explained to [facility] that it is very unlikely her mother is going to survive.  Without the bipap she will likely die in hours - possibly a day.   Dawn understands.  Pierce Crane and the patient's sister all believe the patient is actively dying.  They want her comfortable.  They do not want her to be afraid or to feel like she is suffocating.  NEXT OF KIN:  Daughter, Dawn.    SUMMARY OF RECOMMENDATIONS   Total Comfort Care. Please start morphine drip now.   This evening, 1 hour before the bipap comes off, please increase the morphine drip and give a bolus dose. 30 minutes before removing the bipap please give a dose of ativan. Remove bipap at approximately 9 pm after family has had a chance to say good bye.  Do not restart Bipap.  Patient may remain on nasal cannula prn. Please be  generous with PRN bolus doses of morphine to avoid any respiratory distress.  Patient will likely remain asleep.  Hospital death expected.   Code Status/Advance Care Planning:  DNR    Symptom Management:   Dyspnea:  Morphine gtt  Anxiety:  Ativan  Secretions:  Robinul PRN    Palliative Prophylaxis:   Delirium Protocol and Frequent assessment for dyspnea and anxiety.  Additional Recommendations (Limitations, Scope, Preferences):  Full Comfort Care  Psycho-social/Spiritual:   Desire for further Chaplaincy support:yes  Additional Recommendations: Caregiving  Support/Resources  Prognosis:   Hours - Days  Discharge Planning: Anticipated Hospital Death      Primary Diagnoses: Present on Admission:  . COPD exacerbation ([place]) . Hypertension . Tobacco abuse  I have reviewed the medical record, interviewed the patient and family, and examined the patient. The following aspects are pertinent.  Past Medical History  Diagnosis Date  . Hypertension   . COPD (chronic obstructive pulmonary disease) ([place])   . Anemia   . Headaches, cluster   .  Wears glasses   . CVA (cerebral vascular accident) ([place]) 01/2010  . Complication of anesthesia     " trouble waking up "  . PONV (postoperative nausea and vomiting)   . Shortness of breath   . Arthritis   . Hypercholesteremia    Social History   Social History  . Marital Status: Divorced    Spouse Name: N/A  . Number of Children: 3  . Years of Education: N/A   Social History Main Topics  . Smoking status: Current Some Day Smoker -- 0.10 packs/day for 30 years    Types: Cigarettes  . Smokeless tobacco: Never Used  . Alcohol Use: No  . Drug Use: No  . Sexual Activity: Not Asked   Other Topics Concern  . None   Social History Narrative   Family History  Problem Relation Age of Onset  . Heart disease Father   . Heart disease Mother   . Diabetes Father   . Hypertension Mother   . Cancer Maternal Aunt      ovarian, breast  . Lung disease Father    Scheduled Meds: . arformoterol  15 mcg Nebulization BID  . budesonide (PULMICORT) nebulizer solution  0.25 mg Nebulization BID  . furosemide  40 mg Intravenous Daily  . levalbuterol  0.63 mg Nebulization Q4H   And  . ipratropium  0.5 mg Nebulization Q4H  . LORazepam  1 mg Intravenous Q4H  . nicotine  21 mg Transdermal Daily  . sodium chloride flush  3 mL Intravenous Q12H  . sodium chloride flush  3 mL Intravenous Q12H  . sodium chloride flush  3 mL Intravenous Q12H   Continuous Infusions: . diltiazem (CARDIZEM) infusion 5 mg/hr (06/20/2015 1236)  . morphine     PRN Meds:.sodium chloride, sodium chloride, acetaminophen **OR** acetaminophen, albuterol, antiseptic oral rinse, diphenhydrAMINE, glycopyrrolate **OR** glycopyrrolate **OR** glycopyrrolate, haloperidol **OR** haloperidol **OR** haloperidol lactate, LORazepam, morphine injection, morphine, ondansetron **OR** ondansetron (ZOFRAN) IV, polyvinyl alcohol, sodium chloride flush, sodium chloride flush Medications Prior to Admission:  Prior to Admission medications   Medication Sig Start Date End Date Taking? Authorizing Provider  albuterol (PROVENTIL HFA;VENTOLIN HFA) 108 (90 BASE) MCG/ACT inhaler Inhale 2 puffs into the lungs every 4 (four) hours as needed. For shortness of breath   Yes Historical Provider, MD  aspirin EC 81 MG tablet Take 81 mg by mouth daily.   Yes Historical Provider, MD  budesonide-formoterol (SYMBICORT) 160-4.5 MCG/ACT inhaler Inhale 2 puffs into the lungs 2 (two) times daily.   Yes Historical Provider, MD  clopidogrel (PLAVIX) 75 MG tablet Take 75 mg by mouth at bedtime.   Yes Historical Provider, MD  diltiazem (CARDIZEM CD) 120 MG 24 hr capsule Take 1 capsule (120 mg total) by mouth daily. 12/28/12  Yes Fredia Sorrow, MD  gabapentin (NEURONTIN) 300 MG capsule Take 600 mg by mouth 3 (three) times daily.   Yes Historical Provider, MD  magnesium oxide (MAG-OX) 400 MG  tablet Take 400 mg by mouth 2 (two) times daily.   Yes Historical Provider, MD  metoprolol succinate (TOPROL-XL) 25 MG 24 hr tablet Take 25 mg by mouth daily.   Yes Historical Provider, MD  mirtazapine (REMERON) 15 MG tablet Take 15 mg by mouth at bedtime.   Yes Historical Provider, MD  oxybutynin (DITROPAN XL) 15 MG 24 hr tablet Take 15 mg by mouth at bedtime.   Yes Historical Provider, MD  pravastatin (PRAVACHOL) 20 MG tablet Take 20 mg by mouth daily.   Yes Historical  Provider, MD   Allergies  Allergen Reactions  . Penicillins Anaphylaxis    Anaphylaxis Has patient had a PCN reaction causing immediate rash, facial/tongue/throat swelling, SOB or lightheadedness with hypotension: YES Has patient had a PCN reaction causing severe rash involving mucus membranes or skin necrosis:NO Has patient had a PCN reaction that required hospitalization NO Has patient had a PCN reaction occurring within the last 10 years: NO If all of the above answers are "NO", then may proceed with Cephalosporin use.  . Codeine Rash    Reaction unknown   Review of Systems  Physical Exam  Vital Signs: BP 121/56 mmHg  Pulse 112  Temp(Src) 101.4 F (38.6 C) (Rectal)  Resp 46  Ht _0  (1.626 m)  Wt 69.174 kg (152 lb 8 oz)  BMI 26.16 kg/m2  SpO2 96% Pain Assessment: 0-10   Pain Score: 0-No pain   SpO2: SpO2: 96 % O2 Device:SpO2: 96 % O2 Flow Rate: .O2 Flow Rate (L/min): 4 L/min  IO: Intake/output summary:  Intake/Output Summary (Last 24 hours) at 06-Jul-2015 1551 Last data filed at Jul 06, 2015 1125  Gross per 24 hour  Intake   1113 ml  Output      0 ml  Net   1113 ml    LBM: Last BM Date: 06/16/15 Baseline Weight: Weight: 69.174 kg (152 lb 8 oz) Most recent weight: Weight: 69.174 kg (152 lb 8 oz)     Palliative Assessment/Data:     Time In: 2:30 Time Out: 4:15 Time Total: 105 Greater than 50%  of this time was spent counseling and coordinating care related to the above assessment and  plan.  Signed by: Imogene Burn, PA-C Palliative Medicine Pager: [phone]   Please contact Palliative Medicine Team phone at [phone] for questions and concerns.  For individual provider: See Shea Evans

## 2015-07-11 NOTE — Significant Event (Signed)
Rapid Response Event Note  Overview:  Called by primary RN at request of MD for Respiratory distress Time Called: 0827 Arrival Time: 0830 Event Type: Respiratory  Initial Focused Assessment:  On my arrival to patients room, Rn and Dr. Isidoro [name] at bedside.  Patient in bed on nasal cannula 4 LPM,  With labored breathing.  Skin is warm.  RR 34 with expiratory groaning.  Breath sounds crackles and wheezes.  Patient had received ativan and breathing treatment prior to my arrival.     Interventions: PCXR done, labs drawn, ABG done. Bipap ordered.  Dr. Isidoro [name] notified patients family of status. ABG results 7.23, Co2 78, O2 73 sat 100% on nasal cannula 4 lpm.  Results called to MD   Event Summary: patient transported to 2 c12 with monitor, and oxygen  Rn to call if assistance needed    at      at          [facility] ServicesWolfe, Carolyn [name]

## 2015-07-11 NOTE — Progress Notes (Signed)
   06/29/2015 1200  Clinical Encounter Type  Visited With Patient and family together  Visit Type Initial;Spiritual support;Social support  Referral From Nurse  Spiritual Encounters  Spiritual Needs Emotional;Grief support  Stress Factors  Patient Stress Factors Health changes;Loss of control;Major life changes  Family Stress Factors Health changes;Loss of control;Major life changes  Advance Directives (For Healthcare)  Does patient have an advance directive? No   Chaplain visited with pt and family.  Pt was unresponsive. Chaplain advised by nurse that family was concerned about pt's health and current treatment.  Chaplain talked with daughter of pt and she was distressed that she left her mother last night. She was dealing with feelings of guilt.  Chaplain offered ministry of presence to family member. Chaplain will follow up this afternoon with family to assess their needs.  Rosezella FloridaLisa M Avraj Lindroth  07/05/2015  12:04 PM  [phone]

## 2015-07-11 NOTE — Progress Notes (Signed)
Pharmacy Antibiotic Note  Carolyn [name] is a 75 y.o. female admitted on 06/15/2015 with worsening shortness of breath.  Pharmacy has been consulted for Aztreonam and Vancomycin dosing for COPD exacerbation that has worsened on doxycycline. WBC up to 19.7 today. Acute renal failure is improving. SCr down to 1.34. CrCl ~ 36 mL/min   Plan: -Vancomycin 1500 mg IV Q once followed by Vancomycin 1 gm IV Q 24 hours -Aztreonam 1 gm IV Q 8 hours -Monitor CBC, renal fx, and clinical progress -VT as indicated   Height: 5\' 4"  (162.6 cm) Weight: 152 lb 8 oz (69.174 kg) IBW/kg (Calculated) : 54.7  Temp (24hrs), Avg:98.8 F (37.1 C), Min:98.2 F (36.8 C), Max:99.3 F (37.4 C)   Recent Labs Lab 06/23/2015 1652 07/10/2015 2200 06/18/15 0504 04-05-15 0614  WBC 13.9* 10.8* 11.9* 19.7*  CREATININE 1.23* 1.90* 1.97* 1.34*    Estimated Creatinine Clearance: 36.2 mL/min (by C-G formula based on Cr of 1.34).    Allergies  Allergen Reactions  . Penicillins Anaphylaxis    Anaphylaxis Has patient had a PCN reaction causing immediate rash, facial/tongue/throat swelling, SOB or lightheadedness with hypotension: YES Has patient had a PCN reaction causing severe rash involving mucus membranes or skin necrosis:NO Has patient had a PCN reaction that required hospitalization NO Has patient had a PCN reaction occurring within the last 10 years: NO If all of the above answers are "NO", then may proceed with Cephalosporin use.  . Codeine Rash    Reaction unknown    Antimicrobials this admission: 6/8 Doxy>>6/9 6/9 Vanc>> 6/9 Aztreo>>   Dose adjustments this admission: None   Microbiology results: None   Thank you for allowing pharmacy to be a part of this patient's care.  Vinnie LevelBenjamin Reinhart Saulters, PharmD., BCPS Clinical Pharmacist Pager [phone]

## 2015-07-11 NOTE — Progress Notes (Signed)
Dr. Isidoro Donningai in to see patient  Breathing more rapidly  And noted wheezing and crackles .ausulation.Rapid called and  Decision to move to 2C12,Lasix 40 mg given,chest xray done and blood gas as ordered done, transferred  As ordered family aware

## 2015-07-11 NOTE — Progress Notes (Signed)
   06/18/2015 1600  Clinical Encounter Type  Visited With Family;Patient and family together;Health care provider  Visit Type Follow-up;Psychological support;Spiritual support  Consult/Referral To Faith community  Spiritual Encounters  Spiritual Needs Emotional  Stress Factors  Patient Stress Factors None identified  Family Stress Factors Family relationships;Financial concerns;Health changes;Major life changes    Chaplain visited with Pt and family ans provided emotional and spiritual care. If family is in more need please page the on-call Chaplain   Thanks  Tanja PortBeatrice M Sotiria Keast, Chaplain\

## 2015-07-11 NOTE — Procedures (Signed)
Patient have a  Lot of anxiety this am med given as ordered.  RT gave breathing TX ,

## 2015-07-11 NOTE — Progress Notes (Signed)
[facility]                                                                              Patient Demographics  Carolyn [name], is a 75 y.o. female, DOB - 10-08-1940, ZOX:[id number]  Admit date - 06/18/2015   Admitting Physician Penny Pia, MD  Outpatient Primary MD for the patient is Eartha Inch, MD  Outpatient specialists:   LOS - 2  days    Chief Complaint  Patient presents with  . Shortness of Breath       Brief summary   Carolyn [name] is a 75 y.o. female with medical history significant of advanced COPD on 4 L of supplemental oxygen at homePresented with 2 days of worsening shortness of breath, wheezing. Patient was admitted for COPD exacerbation   Assessment & Plan    Acute on chronic hypoxic respiratory failure with  COPD exacerbation (HCC) - Patient seen earlier this morning, lethargic, wheezing, pursed lip breathing. Rapid response called, stat chest x-ray did not show acute infiltrates. Stat BNP pending, Gave Lasix 40 mg IV 1 - NPO, IV Lopressor, stat ABG: 7.2, 78.6, 73.6, 32 -Discussed with patient's daughter in detail over the phone, okay for BiPAP however no intubation or CPR, DNR - Question if patient had aspirated, placed on vancomycin and aztreonam (PCN allergy)  - Continue Pulmicort, brovana, scheduled nebs, IV Solu-Medrol 11:00am: Patient seen again on BiPAP, still struggling, although much more alert and awake now - Morphine 1 mg for respiratory distress, will place on Cardizem drip for tachycardia, hypertension - Discussed with daughter at the bedside again. Patient is much more alert and okay with BiPAP, does not want intubation.   Tobacco abuse - Nicotine patch  Essential hypertension - BP currently soft  Acute kidney injury -Improving, DC IV fluids   History of CVA - NPO, aspirin and Plavix placed on hold currently   Code Status: DO NOT RESUSCITATE, bipap okay (Dr Cena Benton had discussed with patient on admission per note).  Daughter wants to continue with patient's wishes, however was unclear if patient had wanted BiPAP. Placed palliative care consult for goals of care  DVT Prophylaxis:   SCD's   Family Communication: Discussed in detail with the patient, all imaging results, lab results explained to the patient's daughter at the bedside. Patient's daughter is primary caregiver and has no help, overwhelmed with all the decisions and care. Appreciative of palliative medicine consult. Discussed about hospice.   Disposition Plan:  Time Spent in minutes   35 minutes  Procedures:  BiPAP  Consultants:   Palliative medicine  Antimicrobials:   Doxycycline 6/8     Medications  Scheduled Meds: . arformoterol  15 mcg Nebulization BID  . aspirin EC  81 mg Oral Daily  . budesonide (PULMICORT) nebulizer solution  0.25 mg Nebulization BID  . calcium gluconate  1 g Intravenous Once  . clopidogrel  75 mg Oral QHS  . diltiazem  120 mg Oral Daily  . furosemide      . heparin  5,000 Units Subcutaneous Q8H  . insulin aspart  0-5 Units Subcutaneous QHS  . insulin aspart  0-9 Units  Subcutaneous Q4H  . levalbuterol  0.63 mg Nebulization Q4H   And  . ipratropium  0.5 mg Nebulization Q4H  . magnesium oxide  400 mg Oral BID  . methylPREDNISolone (SOLU-MEDROL) injection  60 mg Intravenous Q8H  . metoprolol  5 mg Intravenous Q8H  . mirtazapine  15 mg Oral QHS  . nicotine  21 mg Transdermal Daily  . pravastatin  20 mg Oral q1800  . sodium chloride flush  3 mL Intravenous Q12H  . sodium chloride flush  3 mL Intravenous Q12H   Continuous Infusions:   PRN Meds:.sodium chloride, acetaminophen **OR** acetaminophen, albuterol, sodium chloride flush   Antibiotics   Anti-infectives    Start     Dose/Rate Route Frequency Ordered Stop   06/18/15 1000  doxycycline (VIBRA-TABS) tablet 100 mg  Status:  Discontinued     100 mg Oral Every 12 hours 06/18/15 0616 08-19-2015 0827        Subjective:   Carolyn [name] was  seen and examined today. Patient seen and examined earlier this morning, lethargic, wheezing diffusely, pursed lip breathing. Unable to obtain review of systems from the patient.  Objective:   Filed Vitals:   06/18/15 2316 08-19-2015 0311 08-19-2015 0529 08-19-2015 0724  BP:   142/59   Pulse:   115   Temp:      TempSrc:      Resp:   18   Height:      Weight:      SpO2: 96% 95% 98% 88%    Intake/Output Summary (Last 24 hours) at 08-19-2015 0840 Last data filed at 08-19-2015 0419  Gross per 24 hour  Intake   1110 ml  Output      0 ml  Net   1110 ml     Wt Readings from Last 3 Encounters:  06/20/2015 69.174 kg (152 lb 8 oz)  12/28/13 76.8 kg (169 lb 5 oz)  03/25/13 65.318 kg (144 lb)     Exam  General: Lethargic, wheezing, Acute respiratory distress  HEENT:    Neck:   Cardiovascular: S1 S2 auscultated, no rubs, murmurs or gallops. Regular rate and rhythm.  Respiratory: Diffuse wheezing expiratory bilaterally, bibasilar crackles   Gastrointestinal: Soft, nontender, nondistended, + bowel sounds  Ext: no cyanosis clubbing or edema  Neuro: unable to assess  Skin: No rashes  Psych: lethargic   Data Reviewed:  I have personally reviewed following labs and imaging studies  Micro Results Recent Results (from the past 240 hour(s))  MRSA PCR Screening     Status: None   Collection Time: 06/18/15  4:41 AM  Result Value Ref Range Status   MRSA by PCR NEGATIVE NEGATIVE Final    Comment:        The GeneXpert MRSA Assay (FDA approved for NASAL specimens only), is one component of a comprehensive MRSA colonization surveillance program. It is not intended to diagnose MRSA infection nor to guide or monitor treatment for MRSA infections.     Radiology Reports Dg Chest Port 1 View  07/03/2015  CLINICAL DATA:  Respiratory distress EXAM: PORTABLE CHEST 1 VIEW COMPARISON:  12/27/2013, 01/13/2012, CT of the chest FINDINGS: Cardiac shadow is stable. The lungs are well aerated  bilaterally. No focal infiltrate or sizable effusion is seen. Small pericardial fat pad is noted on the left adjacent to the spine. This is confirmed on previous CT examination. No bony abnormality is noted. IMPRESSION: No acute abnormality seen. Electronically Signed   By: Eulah PontMark  Lukens M.D.  On: June 19, 2015 16:15    Lab Data:  CBC:  Recent Labs Lab 07/02/2015 1652 June 19, 2015 2200 06/18/15 0504 06/18/2015 0614  WBC 13.9* 10.8* 11.9* 19.7*  NEUTROABS 11.2*  --   --   --   HGB 11.6* 10.8* 10.1* 10.0*  HCT 37.9 36.0 31.8* 31.9*  MCV 97.2 98.4 96.1 96.1  PLT 173 166 164 187   Basic Metabolic Panel:  Recent Labs Lab June 19, 2015 1652 June 19, 2015 2200 06/18/15 0504 06/30/2015 0614  NA 137  --  135 136  K 4.1  --  4.0 5.4*  CL 98*  --  98* 98*  CO2 31  --  30 34*  GLUCOSE 238*  --  215* 141*  BUN 12  --  21* 24*  CREATININE 1.23* 1.90* 1.97* 1.34*  CALCIUM 9.8  --  9.6 10.0  MG  --  3.7*  --   --   PHOS  --  4.7*  --   --    GFR: Estimated Creatinine Clearance: 36.2 mL/min (by C-G formula based on Cr of 1.34). Liver Function Tests: No results for input(s): AST, ALT, ALKPHOS, BILITOT, PROT, ALBUMIN in the last 168 hours. No results for input(s): LIPASE, AMYLASE in the last 168 hours. No results for input(s): AMMONIA in the last 168 hours. Coagulation Profile: No results for input(s): INR, PROTIME in the last 168 hours. Cardiac Enzymes:  Recent Labs Lab June 19, 2015 1652  TROPONINI <0.03   BNP (last 3 results) No results for input(s): PROBNP in the last 8760 hours. HbA1C:  Recent Labs  06/18/15 1454  HGBA1C 5.1   CBG:  Recent Labs Lab 06/18/15 1711 06/18/15 2217  GLUCAP 150* 142*   Lipid Profile: No results for input(s): CHOL, HDL, LDLCALC, TRIG, CHOLHDL, LDLDIRECT in the last 72 hours. Thyroid Function Tests: No results for input(s): TSH, T4TOTAL, FREET4, T3FREE, THYROIDAB in the last 72 hours. Anemia Panel: No results for input(s): VITAMINB12, FOLATE, FERRITIN, TIBC,  IRON, RETICCTPCT in the last 72 hours. Urine analysis:    Component Value Date/Time   COLORURINE YELLOW 12/28/2013 0923   APPEARANCEUR TURBID* 12/28/2013 0923   LABSPEC 1.015 12/28/2013 0923   PHURINE 7.0 12/28/2013 0923   GLUCOSEU NEGATIVE 12/28/2013 0923   HGBUR NEGATIVE 12/28/2013 0923   BILIRUBINUR NEGATIVE 12/28/2013 0923   KETONESUR 15* 12/28/2013 0923   PROTEINUR NEGATIVE 12/28/2013 0923   UROBILINOGEN 0.2 12/28/2013 0923   NITRITE NEGATIVE 12/28/2013 0923   LEUKOCYTESUR SMALL* 12/28/2013 1610     Finas Delone M.D. [facility] 07/05/2015, 8:40 AM  Pager: [phone] Between 7am to 7pm - call Pager - [phone]  After 7pm go to [email or web address] - password [email or web address]  Call night coverage person covering after 7pm

## 2015-07-11 DEATH — deceased
